# Patient Record
Sex: Female | Born: 2000 | Hispanic: No | Marital: Single | Smoking: Never smoker
Health system: Southern US, Community
[De-identification: ages and names within clinical notes are randomized; demographics above are authoritative.]

## PROBLEM LIST (undated history)

## (undated) DIAGNOSIS — IMO0002 Reserved for concepts with insufficient information to code with codable children: Secondary | ICD-10-CM

## (undated) DIAGNOSIS — F32A Depression, unspecified: Secondary | ICD-10-CM

## (undated) DIAGNOSIS — F329 Major depressive disorder, single episode, unspecified: Secondary | ICD-10-CM

## (undated) DIAGNOSIS — M1990 Unspecified osteoarthritis, unspecified site: Secondary | ICD-10-CM

## (undated) DIAGNOSIS — M329 Systemic lupus erythematosus, unspecified: Secondary | ICD-10-CM

## (undated) DIAGNOSIS — F419 Anxiety disorder, unspecified: Secondary | ICD-10-CM

## (undated) DIAGNOSIS — G4700 Insomnia, unspecified: Secondary | ICD-10-CM

## (undated) HISTORY — DX: Reserved for concepts with insufficient information to code with codable children: IMO0002

## (undated) HISTORY — DX: Major depressive disorder, single episode, unspecified: F32.9

## (undated) HISTORY — DX: Depression, unspecified: F32.A

## (undated) HISTORY — DX: Systemic lupus erythematosus, unspecified: M32.9

## (undated) HISTORY — DX: Insomnia, unspecified: G47.00

## (undated) HISTORY — DX: Anxiety disorder, unspecified: F41.9

---
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
## 2018-01-27 ENCOUNTER — Ambulatory Visit: Payer: Self-pay | Admitting: Physician Assistant

## 2018-01-27 VITALS — BP 120/80 | HR 90 | Temp 99.0°F | Resp 20 | Wt 179.4 lb

## 2018-01-27 DIAGNOSIS — R300 Dysuria: Secondary | ICD-10-CM

## 2018-01-27 LAB — POCT URINALYSIS DIPSTICK
Glucose, UA: NEGATIVE
NITRITE UA: NEGATIVE
PROTEIN UA: NEGATIVE
Urobilinogen, UA: NEGATIVE E.U./dL — AB
pH, UA: 6 (ref 5.0–8.0)

## 2018-01-27 MED ORDER — NITROFURANTOIN MONOHYD MACRO 100 MG PO CAPS: 100.0000 mg | ORAL_CAPSULE | Freq: Two times a day (BID) | ORAL | 0 refills | Status: AC

## 2018-01-27 NOTE — Patient Instructions (Addendum)
Urinary Tract Infection, Adult  Your results indicate you have a UTI. I have given you a prescription for an antibiotic. Please take with food. We do not obtain urine cultures at our office; therefore, if your symptoms worsen over the next 24-48 hours or you develop fever, chills, flank pain, nausea and vomiting, please seek care immediately.   A urinary tract infection (UTI) is an infection of any part of the urinary tract. The urinary tract includes:  The kidneys.  The ureters.  The bladder.  The urethra. These organs make, store, and get rid of pee (urine) in the body. What are the causes? This is caused by germs (bacteria) in your genital area. These germs grow and cause swelling (inflammation) of your urinary tract. What increases the risk? You are more likely to develop this condition if:  You have a small, thin tube (catheter) to drain pee.  You cannot control when you pee or poop (incontinence).  You are female, and: ? You use these methods to prevent pregnancy: ? A medicine that kills sperm (spermicide). ? A dievice that blocks sperm (diaphragm). ? You have low levels of a female hormone (estrogen). ? You are pregnant.  You have genes that add to your risk.  You are sexually active.  You take antibiotic medicines.  You have trouble peeing because of: ? A prostate that is bigger than normal, if you are female. ? A blockage in the part of your body that drains pee from the bladder (urethra). ? A kidney stone. ? A nerve condition that affects your bladder (neurogenic bladder). ? Not getting enough to drink. ? Not peeing often enough.  You have other conditions, such as: ? Diabetes. ? A weak disease-fighting system (immune system). ? Sickle cell disease. ? Gout. ? Injury of the spine. What are the signs or symptoms? Symptoms of this condition include:  Needing to pee right away (urgently).  Peeing often.  Peeing small amounts often.  Pain or burning when  peeing.  Blood in the pee.  Pee that smells bad or not like normal.  Trouble peeing.  Pee that is cloudy.  Fluid coming from the vagina, if you are female.  Pain in the belly or lower back. Other symptoms include:  Throwing up (vomiting).  No urge to eat.  Feeling mixed up (confused).  Being tired and grouchy (irritable).  A fever.  Watery poop (diarrhea). How is this treated? This condition may be treated with:  Antibiotic medicine.  Other medicines.  Drinking enough water. Follow these instructions at home:  Medicines  Take over-the-counter and prescription medicines only as told by your doctor.  If you were prescribed an antibiotic medicine, take it as told by your doctor. Do not stop taking it even if you start to feel better. General instructions  Make sure you: ? Pee until your bladder is empty. ? Do not hold pee for a long time. ? Empty your bladder after sex. ? Wipe from front to back after pooping if you are a female. Use each tissue one time when you wipe.  Drink enough fluid to keep your pee pale yellow.  Keep all follow-up visits as told by your doctor. This is important. Contact a doctor if:  You do not get better after 1-2 days.  Your symptoms go away and then come back. Get help right away if:  You have very bad back pain.  You have very bad pain in your lower belly.  You have a fever.  have a fever.  You are sick to your stomach (nauseous).  You are throwing up. Summary  A urinary tract infection (UTI) is an infection of any part of the urinary tract.  This condition is caused by germs in your genital area.  There are many risk factors for a UTI. These include having a small, thin tube to drain pee and not being able to control when you pee or poop.  Treatment includes antibiotic medicines for germs.  Drink enough fluid to keep your pee pale yellow. This information is not intended to replace advice given to you by your health care  provider. Make sure you discuss any questions you have with your health care provider. Document Released: 06/11/2007 Document Revised: 07/02/2017 Document Reviewed: 07/02/2017 Elsevier Interactive Patient Education  2019 Elsevier Inc.  

## 2018-01-27 NOTE — Progress Notes (Signed)
01/27/2018 at 2:57 PM  Bethany Terry / DOB: [birth year] / MRN: [id number]  The patient has Generalized anxiety disorder; Persistent depressive disorder with atypical features, currently moderate; Unobtainable family history due to adoption-from [place] at age 18; Self-inflicted injury; Knee pain; Exercise-induced bronchospasm; Thoracic back pain; Attention deficit hyperactivity disorder (ADHD), combined type, moderate; and Insomnia disorder, with non-sleep disorder mental comorbidity, episodic on their problem list.  SUBJECTIVE  Bethany Terry is a 18 y.o. female who complains of dysuria, urinary frequency and urinary urgency x 2 weeks. She denies hematuria, flank pain, abdominal pain, pelvic pain, cloudy malordorous urine, genital rash, genital irritation and vaginal discharge. Has tried AZO with some relief. Most recent UTI prior to this was a few years ago. She is sexually active with monogamous partner. LMP ~2 weeks ago. Takes OCP daily.   She  has a past medical history of Anxiety, Arthritis, Depression, and Insomnia.    Medications reviewed and updated by myself where necessary, and exist elsewhere in the encounter.   Ms. Kupferman has No Known Allergies. She  reports that she has never smoked. She has never used smokeless tobacco. She reports that she does not drink alcohol or use drugs. She  reports being sexually active. She reports using the following method of birth control/protection: None. The patient  has no past surgical history on file.  Her family history is not on file. She was adopted.  Review of Systems  Constitutional: Negative for chills, diaphoresis and fever.  Gastrointestinal: Negative for nausea and vomiting.  Neurological: Negative for dizziness.    OBJECTIVE  Her  weight is 179 lb 6.4 oz (81.4 kg). Her oral temperature is 99 F (37.2 C). Her blood pressure is 120/80 and her pulse is 90. Her respiration is 20 and oxygen saturation is 99%.  The patient's body mass index is 33.9  kg/m.  Physical Exam Vitals signs reviewed.  Constitutional:      General: She is not in acute distress.    Appearance: Normal appearance. She is well-developed.  HENT:     Head: Normocephalic and atraumatic.  Eyes:     Conjunctiva/sclera: Conjunctivae normal.  Neck:     Musculoskeletal: Normal range of motion.  Pulmonary:     Effort: Pulmonary effort is normal.  Abdominal:     Palpations: Abdomen is soft.     Tenderness: There is no abdominal tenderness. There is no right CVA tenderness, left CVA tenderness or guarding.  Skin:    General: Skin is warm and dry.  Neurological:     Mental Status: She is alert and oriented to person, place, and time.     Results for orders placed or performed in visit on 01/27/18 (from the past 24 hour(s))  POCT urinalysis dipstick     Status: Abnormal   Collection Time: 01/27/18  2:40 PM  Result Value Ref Range   Color, UA Yellow    Clarity, UA Clear    Glucose, UA Negative Negative   Bilirubin, UA Small+    Ketones, UA Large    Spec Grav, UA >=1.030 (A) 1.010 - 1.025   Blood, UA Trace    pH, UA 6.0 5.0 - 8.0   Protein, UA Negative Negative   Urobilinogen, UA negative (A) 0.2 or 1.0 E.U./dL   Nitrite, UA Neg    Leukocytes, UA Moderate (2+) (A) Negative   Appearance     Odor      ASSESSMENT & PLAN  Rande was seen today for self pay- burn when  urinate, discomfort, frequentx2weeks0.  Diagnoses and all orders for this visit:  Dysuria -     POCT urinalysis dipstick  Other orders -     nitrofurantoin, macrocrystal-monohydrate, (MACROBID) 100 MG capsule; Take 1 capsule (100 mg total) by mouth 2 (two) times daily for 5 days.     Hx and UA suggestive of UTI. + leukocytes, blood, and ketones. Spec gravity elevated.  No glucosuria.  Will treat empirically at this time for UTI.  No concerning signs or symptoms suggesting pyelonephritis.  She is overall well-appearing, no acute distress.  She is afebrile.  Explained to patient that we  do not obtain urine cultures in our office.  If she is not having any improvement over the next 24 to 48 hours, recommend follow-up with family doctor as she will likely need urine culture at that time.  If any of her symptoms worsen or she develops new concerning symptoms, recommend she seek care immediately at local urgent care or ED.   Benjiman Core, Cordelia Poche  [facility] 01/27/2018 2:57 PM

## 2018-01-29 ENCOUNTER — Telehealth: Payer: Self-pay

## 2018-01-29 NOTE — Telephone Encounter (Signed)
Patient did not answered the voicemail is not set up.

## 2018-02-08 ENCOUNTER — Ambulatory Visit: Payer: BC Managed Care – PPO | Admitting: Psychiatry

## 2018-02-15 ENCOUNTER — Ambulatory Visit (INDEPENDENT_AMBULATORY_CARE_PROVIDER_SITE_OTHER): Payer: Self-pay | Admitting: Psychiatry

## 2018-02-15 ENCOUNTER — Encounter: Payer: Self-pay | Admitting: Psychiatry

## 2018-02-15 VITALS — BP 110/72 | HR 72 | Ht 61.0 in | Wt 178.0 lb

## 2018-02-15 DIAGNOSIS — F902 Attention-deficit hyperactivity disorder, combined type: Secondary | ICD-10-CM

## 2018-02-15 DIAGNOSIS — F5105 Insomnia due to other mental disorder: Secondary | ICD-10-CM

## 2018-02-15 DIAGNOSIS — F411 Generalized anxiety disorder: Secondary | ICD-10-CM

## 2018-02-15 DIAGNOSIS — F341 Dysthymic disorder: Secondary | ICD-10-CM

## 2018-02-15 DIAGNOSIS — G4700 Insomnia, unspecified: Secondary | ICD-10-CM

## 2018-02-15 MED ORDER — AMPHETAMINE-DEXTROAMPHET ER 30 MG PO CP24: 30.0000 mg | ORAL_CAPSULE | Freq: Every day | ORAL | 0 refills | Status: DC

## 2018-02-15 NOTE — Progress Notes (Signed)
[facility] Med Check  Patient ID: Bethany Terry,  MRN: [id number]  PCP: Sherren Mocha, MD  Date of Evaluation: 02/15/2018 Time spent:10 minutes  Chief Complaint:  Chief Complaint    ADHD; Anxiety; Depression      HISTORY/CURRENT STATUS: Bethany Terry is seen individually face-to-face with consent not collateral for psychiatric interview and exam in 14-month evaluation and management of ADHD/GAD/mild persistent depression.  She planned to take Adderall and Xanax last appointment, but [facility] of controlled substance documents she last filled both 11/09/2017 at last appointment and none since.  She has 2 refills of Xanax remaining and doubts that her Adderall fills are available at pharmacy.  She continues dating boyfriend and has her own apartment, spending little time with adoptive parents.  She is not seeing [name] for therapy as last session was 08/21/2017.  Patient seems to disapprove of her hard work rather than having a sense of accomplishment, though she does keep going better when she feels pushed even if by herself.  She is not willing to consider antidepressant such as Pamelor again, and Abilify/Geodon discussed at last appointment as an alternative approach to mood and anxiety when all others have failed in her opinion are not acceptable today.  Depression       The patient presents with depression.  This is a recurrent problem.  The current episode started more than 1 year ago.   The onset quality is gradual.   The problem occurs intermittently.  The problem has been waxing and waning since onset.  Associated symptoms include decreased concentration, fatigue, hopelessness, decreased interest and sad.  Associated symptoms include not irritable, no restlessness, no appetite change, no body aches, no headaches, no indigestion and no suicidal ideas.     The symptoms are aggravated by work stress, social issues and family issues.  Past treatments include SNRIs - Serotonin and  norepinephrine reuptake inhibitors, SSRIs - Selective serotonin reuptake inhibitors, other medications and psychotherapy.  Compliance with treatment is variable.  Past compliance problems include difficulty with treatment plan and medication issues.  Previous treatment provided mild relief.  Risk factors include a change in medication usage/dosage, history of mental illness, stress and major life event.   Past medical history includes recent illness, anxiety, depression, mental health disorder and post-traumatic stress disorder.     Pertinent negatives include no chronic fatigue syndrome, no fibromyalgia, no thyroid problem, no physical disability, no recent psychiatric admission, no bipolar disorder, no eating disorder, no obsessive-compulsive disorder, no schizophrenia, no suicide attempts and no head trauma.   Individual Medical History/ Review of Systems: Changes? :Yes Despite stopping Cymbalta in September for patient suspecting it was a cause of fatigue, the patient remains tired as always now attributing that to working full-time and having 4 credit hours of school this semester.  Patient may have some satisfaction and sense of security in being fatigued as if testimony to how hard she works and parents doubted.  She was seen by urgent care for urinary symptoms twice in the interim treated with Macrodantin last.  Allergies: Patient has no known allergies.  Current Medications:  Current Outpatient Medications:  .  albuterol (VENTOLIN HFA) 108 (90 Base) MCG/ACT inhaler, Inhale 2 puffs into the lungs every 6 (six) hours as needed for wheezing., Disp: 1 Inhaler, Rfl: 0 .  ALPRAZolam (XANAX) 1 MG tablet, Take 1 tablet (1 mg total) by mouth 2 (two) times daily as needed for anxiety., Disp: 60 tablet, Rfl: 3 .  amphetamine-dextroamphetamine (ADDERALL XR)  30 MG 24 hr capsule, Take 1 capsule (30 mg total) by mouth every morning. (Patient not taking: Reported on 10/10/2017), Disp: 30 capsule, Rfl: 0 .   amphetamine-dextroamphetamine (ADDERALL XR) 30 MG 24 hr capsule, Take 1 capsule (30 mg total) by mouth daily for 30 days., Disp: 30 capsule, Rfl: 0 .  [START ON 03/17/2018] amphetamine-dextroamphetamine (ADDERALL XR) 30 MG 24 hr capsule, Take 1 capsule (30 mg total) by mouth daily for 30 days., Disp: 30 capsule, Rfl: 0 .  [START ON 04/16/2018] amphetamine-dextroamphetamine (ADDERALL XR) 30 MG 24 hr capsule, Take 1 capsule (30 mg total) by mouth daily for 30 days., Disp: 30 capsule, Rfl: 0 .  cyclobenzaprine (FLEXERIL) 10 MG tablet, Take 1 tablet (10 mg total) 3 (three) times daily as needed by mouth for muscle spasms. (Patient not taking: Reported on 10/10/2017), Disp: 30 tablet, Rfl: 0 .  HYDROcodone-acetaminophen (NORCO/VICODIN) 5-325 MG tablet, Take 1 tablet by mouth every 6 (six) hours as needed for severe pain. (Patient not taking: Reported on 01/27/2018), Disp: 10 tablet, Rfl: 0 .  meloxicam (MOBIC) 15 MG tablet, Take 1 tablet (15 mg total) daily by mouth. (Patient not taking: Reported on 10/10/2017), Disp: 14 tablet, Rfl: 0 Medication Side Effects: none  Family Medical/ Social History: Changes? No  MENTAL HEALTH EXAM: Muscle strengths and tone 5/5, postural reflexes and gait 0/0, and AIMS = 0. Blood pressure 110/72, pulse 72, height 5\' 1"  (1.549 m), weight 178 lb (80.7 kg).Body mass index is 33.63 kg/m.  General Appearance: Casual, Fairly Groomed, Guarded and Obese  Eye Contact:  Fair  Speech:  Blocked and Clear and Coherent  Volume:  Normal  Mood:  Anxious, Dysphoric, Irritable and Worthless  Affect:  Constricted, Inappropriate, Labile and Anxious  Thought Process:  Goal Directed, Irrelevant and Linear  Orientation:  Full (Time, Place, and Person)  Thought Content: Obsessions, Paranoid Ideation and Rumination   Suicidal Thoughts:  No  Homicidal Thoughts:  No  Memory:  Immediate;   Good Remote;   Good  Judgement:  Fair  Insight:  Fair and Lacking  Psychomotor Activity:  Normal and  Decreased  Concentration: Concentration: Fair and Attention: Fair  Recall:  [employer] of Knowledge: Good  Language: Good  Assets:  Resilience Talents/Skills Vocational/Educational  ADL's:  Intact  Cognition: WNL  Prognosis:  Good    DIAGNOSES:    ICD-10-CM   1. Attention deficit hyperactivity disorder (ADHD), combined type, moderate F90.2 amphetamine-dextroamphetamine (ADDERALL XR) 30 MG 24 hr capsule    amphetamine-dextroamphetamine (ADDERALL XR) 30 MG 24 hr capsule    amphetamine-dextroamphetamine (ADDERALL XR) 30 MG 24 hr capsule  2. Generalized anxiety disorder F41.1   3. Persistent depressive disorder with atypical features, currently mild F34.1   4. Insomnia disorder, with non-sleep disorder mental comorbidity, episodic G47.00     Receiving Psychotherapy: Yes though last attended 6 months ago with [name], [facility]   RECOMMENDATIONS: Patient offers no specific reason for continuing to abstain from her Adderall as she describes fatigue that she attributed to other medications discontinued without resolution.  She may be attempting to washout medication to see if she feels and functions better.  She anticipates episodic need for Xanax mainly keeping it available if needed for anxiety as she had taken it frequently before work often 2 of the 1 mg tablets.  Adderall is escribed 30 mg XR as 1 capsule every morning #60 each for February, March, and April sent to [facility] on [address] [facility] if she will fill  these.  She declines Pamelor and seems to manifest functioning better than she describes or accepts having apparently disengaged from all treatment except residual Adderall and Xanax as needed.  Therefore she allows minimal treatment now while expecting that she will need more.   Chauncey MannGlenn E Levoy Geisen, MD

## 2018-03-15 ENCOUNTER — Ambulatory Visit: Payer: BLUE CROSS/BLUE SHIELD | Admitting: Psychiatry

## 2018-03-15 ENCOUNTER — Encounter: Payer: Self-pay | Admitting: Psychiatry

## 2018-03-15 DIAGNOSIS — F411 Generalized anxiety disorder: Secondary | ICD-10-CM | POA: Diagnosis not present

## 2018-03-15 DIAGNOSIS — F902 Attention-deficit hyperactivity disorder, combined type: Secondary | ICD-10-CM

## 2018-03-15 NOTE — Progress Notes (Signed)
      [facility] Counselor/Therapist Progress Note  Patient ID: Ryah Mirro, MRN: [id number],    Date: 03/15/2018  Time Spent: 51 minutes  Treatment Type: Individual Therapy  Reported Symptoms: anxiety, focusing, panic attack  Mental Status Exam:  Appearance:   Casual     Behavior:  Appropriate  Motor:  Normal  Speech/Language:   Normal Rate  Affect:  Congruent  Mood:  anxious  Thought process:  normal  Thought content:    Rumination  Sensory/Perceptual disturbances:    WNL  Orientation:  oriented to person, place, time/date and situation  Attention:  Good  Concentration:  Good  Memory:  WNL  Fund of knowledge:   Good  Insight:    Good  Judgment:   Good  Impulse Control:  Good   Risk Assessment: Danger to Self:  No Self-injurious Behavior: No Danger to Others: No Duty to Warn:no Physical Aggression / Violence:No  Access to Firearms a concern: No  Gang Involvement:No   Subjective: Patient was present for session.  Patient explained she had gone so long between appointments due to losing her insurance.  Patient explained that her mother did not tell her she was canceling her insurance and then they had to scramble to get something very quickly and it took 2 months to get her cards.  Patient went on to explain she is very frustrated with her mother because of the way she handled the situation.  Patient and her father are continuing to do fine even though she is disappointed with him for not telling her what her mother was planning.  Patient developed treatment plan in session.  She reported her anxiety was her biggest concern and she wanted to address that.  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  She also explained she is trying to figure out what to do with her friend who's behaviors have not been healthy recently.  Patient was encouraged to take things 1  step at a time and to start thinking more about what she does want for her future.  Patient was encouraged to write things down and her bring them to the next session to process through further.  Interventions: Solution-Oriented/Positive Psychology  Diagnosis:   ICD-10-CM   1. Generalized anxiety disorder F41.1   2. Attention deficit hyperactivity disorder (ADHD), combined type F90.2     Plan: 1.  Patient to continue to engage in individual counseling 2-4 times a month or as needed. 2.  Patient to identify and apply CBT, coping skills learned in session to decrease depression and anxiety symptoms. 3.  Patient to contact this office, go to the local ED or call 911 if a crisis or emergency develops between visits.  [name], [place]    This record has been created using [employer].  Chart creation errors have been sought, but may not always have been located and corrected. Such creation errors do not reflect on the standard of medical care.

## 2018-04-09 ENCOUNTER — Ambulatory Visit (INDEPENDENT_AMBULATORY_CARE_PROVIDER_SITE_OTHER): Payer: BLUE CROSS/BLUE SHIELD | Admitting: Psychiatry

## 2018-04-09 ENCOUNTER — Encounter: Payer: Self-pay | Admitting: Psychiatry

## 2018-04-09 ENCOUNTER — Other Ambulatory Visit: Payer: Self-pay

## 2018-04-09 DIAGNOSIS — F902 Attention-deficit hyperactivity disorder, combined type: Secondary | ICD-10-CM

## 2018-04-09 DIAGNOSIS — F411 Generalized anxiety disorder: Secondary | ICD-10-CM | POA: Diagnosis not present

## 2018-04-09 NOTE — Progress Notes (Signed)
[facility] Counselor/Therapist Progress Note  Patient ID: Bethany Terry, MRN: [id number],    Date: 04/09/2018  Time Spent: 50 minutes Start time 9:02 a.m. end time 9:52 AM  I connected with patient by a video enabled telemedicine application or telephone, with their informed consent, and verified patient privacy and that I am speaking with the correct person using two identifiers.  I was located at home and patient at her home.  Treatment Type: Individual Therapy  Reported Symptoms: anxiety, frustration  Mental Status Exam:  Appearance:   NA     Behavior:  Sharing  Motor:  NA  Speech/Language:   Normal Rate  Affect:  NA  Mood:  irritable  Thought process:  normal  Thought content:    WNL  Sensory/Perceptual disturbances:    WNL  Orientation:  oriented to person, place, time/date and situation  Attention:  Good  Concentration:  Good  Memory:  WNL  Fund of knowledge:   Good  Insight:    Good  Judgment:   Good  Impulse Control:  Good   Risk Assessment: Danger to Self:  No Self-injurious Behavior: No Danger to Others: No Duty to Warn:no Physical Aggression / Violence:No  Access to Firearms a concern: No  Gang Involvement:No   Subjective: Met patient via phone.  Patient reported she has been very stressed out and overwhelmed during the situation.  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  Patient explained she is hopeful that when she gets her certification she can get a different job and to start nursing school which is what she is wanting to do.  Patient explained she has been working in [employer] for many years and she is ready for something different.  Patient was encouraged to think of what she needs to tell her self just  to get over this difficult time and to realize she is going to have a new career that this situation is not forever.  Patient was able to think of some self talk/CBT skills to utilize to help her.  Patient also reported she is very frustrated with her parental situation.  She shared that her father had to ask her for money and she felt lots of guilt because she did not have much to give him.  Patient explained it is hard for her to have to feel like the caretaker of her parents when she is only 18 years old.  Patient's emotions were acknowledged and she was encouraged to try and find ways to set limits with her parents as well as other people.  Patient reported that she seems to be somewhat everybody goes to with their problems and then she gets very overwhelmed.  The importance of recognizing she can listen but not fix other people's issues was discussed with patient.  Different strategies to help her maintain perspective were discussed and plans developed.  Agreed to work on the issue more at next session.  Interventions: Cognitive Behavioral Therapy and Solution-Oriented/Positive Psychology  Diagnosis:   ICD-10-CM   1. Generalized anxiety disorder F41.1   2. Attention deficit hyperactivity disorder (ADHD), combined type F90.2     Plan: 1.  Patient to continue to engage in individual counseling 2-4 times a month  or as needed. 2.  Patient to identify and apply CBT, coping skills learned in session to decrease focusing issues and anxiety symptoms. 3.  Patient to contact this office, go to the local ED or call 911 if a crisis or emergency develops between visits.  [name], [facility]

## 2018-04-20 ENCOUNTER — Telehealth: Payer: Self-pay | Admitting: Physician Assistant

## 2018-04-20 ENCOUNTER — Ambulatory Visit: Payer: Self-pay | Admitting: *Deleted

## 2018-04-20 DIAGNOSIS — R519 Headache, unspecified: Secondary | ICD-10-CM

## 2018-04-20 DIAGNOSIS — R509 Fever, unspecified: Secondary | ICD-10-CM

## 2018-04-20 DIAGNOSIS — R51 Headache: Secondary | ICD-10-CM

## 2018-04-20 NOTE — Progress Notes (Signed)
E-Visit for [employer] Virus Screening  Based on what you have shared with me, you need to seek an evaluation for a severe illness that is causing your symptoms which may be coronavirus or some other illness. I recommend that you be seen and evaluated "face to face". Our Emergency Departments are best equipped to handle patients with severe symptoms.   I recommend the following:  If you are having a true medical emergency please call 911. If you are considered high risk for Corona virus because of a known exposure, fever, shortness of breath and cough, OR if you have severe symptoms of any kind, seek medical care at an emergency room.  Please call ahead and tell them that you were seen by telemedicine and they have recommended that you have a face to face evaluation. [facility] Emergency Department [address] [place], [place], [place] [address] [phone]  [facility] Emergency Department [address] [place], [place] [address] [phone]  [facility] Emergency Department [address] [place], [place], [place] [address] [phone]  [facility] Emergency Department [address] [place], [place], [place] [address] [phone]  [facility] Emergency Department [address] [place], [place], [place] [address] [phone]  NOTE: If you entered your credit card information for this eVisit, you will not be charged. You may see a "hold" on your card for the $35 but that hold will drop off and you will not have a charge processed.   Your e-visit answers were reviewed by a board certified advanced clinical practitioner to complete your personal care plan.  Thank you for using e-Visits.   ===View-only below this line===   ----- Message -----    From: Karna Dupes    Sent: 04/20/2018 11:44 AM EDT      To: E-Visit Mailing List Subject: E-Visit Submission: CoronaVirus (COVID-19)  Screening  E-Visit Submission: CoronaVirus (COVID-19) Screening --------------------------------  Question: Do you have any of the following?  Answer:   Shortness of breath            Fever  Question: If you are experiencing trouble breathing please select the severity of this:  Answer:   I have mild trouble breathing but not very often  Question: Do you have any of the following additional symptoms?  Answer:   Body aches            Headache  Question: Have you had a fever? Answer:   Yes  Question: If you are running a fever, please type in your temperature reading Answer:   100.9  Question: How long have you had the fever? Answer:   For a few days  Question: Have others in your home or workplace had similar symptoms? Answer:   Yes  Question: When did your symptoms start? Answer:   Last night mainly  Question: Have you recently visited any of the following countries? Answer:   None of these  Question: If you have traveled anywhere in the last  2 months please document where you have visited: Answer:     Question: Have you recently been around others from these countries or visited these countries who have had coughing or fever? Answer:   No  Question: Have you recently been around anyone who has been diagnosed with Corona virus? Answer:   Yes  Question: Have you been taking any medications? Answer:   Yes  Question: If taking medications for these symptoms, please  list the names and whether they are helping or not Answer:   Ibuprofen- made symptoms significantly worse  Question: Are you treated for any of the following conditions: Asthma, COPD, Diabetes, Renal Failure (on Dialysis), AIDS, any Neuromuscular disease that effects the clearing of secretions, Heart Failure, or Heart Disease? Answer:   Yes  Question: Please enter a phone number where you can be reached if we have additional questions about your symptoms Answer:   [phone]  Question: Please list your  medication allergies that you may have ? (If 'none' , please list as 'none') Answer:   Aleve  Question: Please list any additional comments  Answer:   Also getting severe headaches and migraines, nausea, some pain when taking a deep breath sometimes, chest pain/pressure, hearing loss  Question: Are you pregnant? Answer:   I am confident that I am not pregnant  Question: Are you breastfeeding? Answer:   No

## 2018-04-20 NOTE — Telephone Encounter (Signed)
Pt was evaluated today with an e-visit.

## 2018-04-20 NOTE — Telephone Encounter (Signed)
Headache,nausea, temperature, respiratory symptoms.  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  Reason for Disposition . [1] Fever (or feeling feverish) OR cough AND [2] within 14 Days of COVID-19 EXPOSURE (Close Contact)  Answer Assessment - Initial Assessment Questions 1. 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 PLACE of CONTACT: "Where were you when you were exposed to COVID-19?" (e.g., home, school, medical waiting room; which city?)     work 3. TYPE of CONTACT: "How much contact was there?" (e.g., sitting next to, live in same house, work in same office, same building)     Varied- delivery of meals and work in [employer] before closed- on 2 weeks ago 4. DURATION of CONTACT: "How long were you in contact with the COVID-19 patient?" (e.g., a few seconds, passed by person, a few minutes, live with the patient)     Patient was delivering in wing of resident who tested positive. 5. DATE of CONTACT: "When did you have contact with a COVID-19 patient?" (e.g., how many days ago)     2 weeks ago- patient does not work in that wing frequently 6. TRAVEL: "Have you traveled out of the country recently?" If so, "When and where?"     * Also ask about out-of-state travel, since the CDC has identified some high risk cities for community spread in the [place].     * Note: Travel becomes less relevant if there is widespread community transmission where the patient lives.     No travel 7. COMMUNITY SPREAD: "Are there lots of cases or COVID-19 (community spread) where you live?" (See public  health department website, if unsure)   * MAJOR community spread: high number of cases; numbers of cases are increasing; many people hospitalized.   * MINOR community spread: low number of cases; not increasing; few or no people hospitalized     Community spread 8. SYMPTOMS: "Do you have any symptoms?" (e.g., fever, cough, breathing difficulty)     Fever- 100.9 last night breathing- comes/goes- exertion makes her out of breath and have chest pain- full deep breath causes pain most of time, nausea- last night, headache- in eyes and back of head, dizziness and lightness 9. PREGNANCY OR POSTPARTUM: "Is there any chance you are pregnant?" "When was your last menstrual period?" "Did you deliver in the last 2 weeks?"     Test-negative- LMP- 1 week ago 10. HIGH RISK: "Do you have any heart or lung problems? Do you have a weak immune system?" (e.g., CHF, COPD, asthma, HIV positive, chemotherapy, renal failure, diabetes mellitus, sickle cell anemia)       Asthma history  Protocols used: CORONAVIRUS (COVID-19) EXPOSURE-A-AH

## 2018-06-14 ENCOUNTER — Other Ambulatory Visit: Payer: Self-pay

## 2018-06-14 ENCOUNTER — Ambulatory Visit (INDEPENDENT_AMBULATORY_CARE_PROVIDER_SITE_OTHER): Payer: BC Managed Care – PPO | Admitting: Psychiatry

## 2018-06-14 ENCOUNTER — Encounter: Payer: Self-pay | Admitting: Psychiatry

## 2018-06-14 VITALS — Ht 61.0 in | Wt 178.0 lb

## 2018-06-14 DIAGNOSIS — F341 Dysthymic disorder: Secondary | ICD-10-CM | POA: Diagnosis not present

## 2018-06-14 DIAGNOSIS — G4700 Insomnia, unspecified: Secondary | ICD-10-CM

## 2018-06-14 DIAGNOSIS — F411 Generalized anxiety disorder: Secondary | ICD-10-CM

## 2018-06-14 DIAGNOSIS — F902 Attention-deficit hyperactivity disorder, combined type: Secondary | ICD-10-CM | POA: Diagnosis not present

## 2018-06-14 DIAGNOSIS — F5105 Insomnia due to other mental disorder: Secondary | ICD-10-CM

## 2018-06-14 MED ORDER — ALPRAZOLAM 1 MG PO TABS: 1.0000 mg | ORAL_TABLET | Freq: Two times a day (BID) | ORAL | 3 refills | Status: DC | PRN

## 2018-06-14 MED ORDER — AMPHETAMINE-DEXTROAMPHET ER 30 MG PO CP24: 30.0000 mg | ORAL_CAPSULE | Freq: Every day | ORAL | 0 refills | Status: DC

## 2018-06-14 NOTE — Progress Notes (Signed)
[facility]  Patient ID: Bethany Terry,  MRN: [id number]  PCP: Bethany Terry  Date of Evaluation: 06/14/2018 Time spent:20 minutes from 0900 to 0920  Chief Complaint:  Chief Complaint    Anxiety; ADHD; Depression; Stress      HISTORY/CURRENT STATUS: Bethany Terry is seen in office individually face-to-face with consent with epic collateral for psychiatric interview and exam in 6479-month evaluation and management of generalized anxiety, dysthymia, ADHD, and insomnia.  Patient had febrile illness from mid April for 2 weeks requiring ED and multiple outpatient reviews negative for COVID but not responding to symptom management well.  Her difficulty with sense of breathing and need to vomit became migraine daily for 3 weeks.  She is now back [facility] [employer] [facility] on the job for 2 weeks as she and boyfriend will likely buy a home together in 1 week.  She has little contact with adoptive parents.  She uses Xanax only as needed with last fill per [place] registry being 02/24/2018 and last fill for Adderall 05/13/2018 though she is only taking Adderall now for in the morning for work.  She is exercising more but has no place or opportunity for such.  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  Weight is stable at 178 pounds.  She has numerous somatic concerns like always with no personal interest or conclusion for changing medication management.  She has no mania, suicidality, psychosis, or delirium.   Depression       The patient presents with depression as a recurrent problem.  The current episode started more than 1 year ago.   The onset quality is gradual.   The problem occurs intermittently.  The problem has been waxing and waning since onset.  Associated symptoms include decreased concentration, fatigue, hopelessness, headaches, body aches, decreased interest and sad.  Associated symptoms include not irritable, no restlessness, no  appetite change, no indigestion and no suicidal ideas.     The symptoms are aggravated by work stress, social issues and family issues.  Past treatments include SNRIs - Serotonin and norepinephrine reuptake inhibitors, SSRIs - Selective serotonin reuptake inhibitors, other medications and psychotherapy.  Compliance with treatment is variable.  Past compliance problems include difficulty with treatment plan and medication issues.  Previous treatment provided mild relief.  Risk factors include a change in medication usage/dosage, history of mental illness, stress and major life event.   Past medical history includes recent illness, anxiety, depression, mental health disorder and post-traumatic stress disorder.     Pertinent negatives include no chronic fatigue syndrome, no fibromyalgia, no thyroid problem, no physical disability, no recent psychiatric admission, no bipolar disorder, no eating disorder, no obsessive-compulsive disorder, no schizophrenia, no suicide attempts and no head trauma.  Individual Medical History/ Review of Systems: Changes? :Yes Coronavirus screen was apparently through her employer.  Allergies: Patient has no known allergies.  Current Medications:  Current Outpatient Medications:  .  albuterol (VENTOLIN HFA) 108 (90 Base) MCG/ACT inhaler, Inhale 2 puffs into the lungs every 6 (six) hours as needed for wheezing., Disp: 1 Inhaler, Rfl: 0 .  ALPRAZolam (XANAX) 1 MG tablet, Take 1 tablet (1 mg total) by mouth 2 (two) times daily as needed for anxiety., Disp: 60 tablet, Rfl: 3 .  amphetamine-dextroamphetamine (ADDERALL XR) 30 MG 24 hr capsule, Take 1 capsule (30 mg total) by mouth every morning. (Patient not taking: Reported on 10/10/2017), Disp: 30 capsule, Rfl: 0 .  amphetamine-dextroamphetamine (ADDERALL XR) 30 MG 24 hr capsule, Take 1 capsule (30 mg total) by mouth daily for 30 days., Disp: 30 capsule, Rfl: 0 .  amphetamine-dextroamphetamine (ADDERALL XR) 30 MG 24 hr capsule, Take 1  capsule (30 mg total) by mouth daily for 30 days., Disp: 30 capsule, Rfl: 0 .  amphetamine-dextroamphetamine (ADDERALL XR) 30 MG 24 hr capsule, Take 1 capsule (30 mg total) by mouth daily for 30 days., Disp: 30 capsule, Rfl: 0 .  cyclobenzaprine (FLEXERIL) 10 MG tablet, Take 1 tablet (10 mg total) 3 (three) times daily as needed by mouth for muscle spasms. (Patient not taking: Reported on 10/10/2017), Disp: 30 tablet, Rfl: 0 .  HYDROcodone-acetaminophen (NORCO/VICODIN) 5-325 MG tablet, Take 1 tablet by mouth every 6 (six) hours as needed for severe pain. (Patient not taking: Reported on 01/27/2018), Disp: 10 tablet, Rfl: 0 .  meloxicam (MOBIC) 15 MG tablet, Take 1 tablet (15 mg total) daily by mouth. (Patient not taking: Reported on 10/10/2017), Disp: 14 tablet, Rfl: 0   Medication Side Effects: none  Family Medical/ Social History: Changes? Yes planning [employer] school as she boyfriend buy a house together and she continues working as well as her training  [place]:  Height 5\' 1"  (1.549 m), weight 178 lb (80.7 kg).Body mass index is 33.63 kg/m.  Otherwise deferred for coronavirus pandemic  General Appearance: Casual, Fairly Groomed, Guarded and Obese  Eye Contact:  Fair  Speech:  Clear and Coherent, Normal Rate and Talkative  Volume: Decreased to normal  Mood:  Anxious, Depressed, Dysphoric, Irritable and Worthless  Affect:  Depressed, Full Range and Anxious  Thought Process:  Coherent, Goal Directed and Linear  Orientation:  Full (Time, Place, and Person)  Thought Content: Ilusions, Obsessions and Rumination   Suicidal Thoughts:  No  Homicidal Thoughts:  No  Memory: Immediate:  Good Long-term remote:  Fair  Judgement:  Fair  Insight:  Fair and Lacking  Psychomotor Activity:  Normal, Decreased, Mannerisms and Restlessness  Concentration:  Concentration: Fair and Attention Span: Poor  Recall:  [employer] of Knowledge: Fair  Language: Fair  Assets:  Desire for  Improvement Leisure Time Talents/Skills  ADL's:  Intact  Cognition: WNL  Prognosis:  Fair    DIAGNOSES:    ICD-10-CM   1. Generalized anxiety disorder F41.1   2. 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 Attention deficit hyperactivity disorder (ADHD), combined type, moderate F90.2   4. 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   RECOMMENDATIONS: Symptoms are generally moderate but at times mild though considered manageable by patient with her Xanax and Adderall.  She continues as in the recent past to refuse Pristiq, Pamelor, or Abilify for more comprehensive symptom management.  At this time she is E scribed Adderall 30 mg XR every morning for ADHD as a 30 day supply each for June 8, July 8, and August 7 for [facility] [address]  She is E scribed to continue alprazolam 1 mg twice daily as needed for anxiety or agitation sent as #60 with [facility] [address]  [place] returns for follow-up in 4 months sooner if needed.   Delight Hoh, Terry

## 2018-09-27 ENCOUNTER — Encounter

## 2018-09-27 ENCOUNTER — Ambulatory Visit: Payer: BC Managed Care – PPO | Admitting: Psychiatry

## 2018-10-18 ENCOUNTER — Ambulatory Visit: Payer: BC Managed Care – PPO | Admitting: Psychiatry

## 2018-10-25 ENCOUNTER — Encounter: Payer: Self-pay | Admitting: Psychiatry

## 2018-10-25 ENCOUNTER — Other Ambulatory Visit: Payer: Self-pay

## 2018-10-25 ENCOUNTER — Ambulatory Visit (INDEPENDENT_AMBULATORY_CARE_PROVIDER_SITE_OTHER): Payer: Self-pay | Admitting: Psychiatry

## 2018-10-25 VITALS — Ht 61.0 in | Wt 185.0 lb

## 2018-10-25 DIAGNOSIS — F902 Attention-deficit hyperactivity disorder, combined type: Secondary | ICD-10-CM

## 2018-10-25 DIAGNOSIS — F341 Dysthymic disorder: Secondary | ICD-10-CM

## 2018-10-25 DIAGNOSIS — F5105 Insomnia due to other mental disorder: Secondary | ICD-10-CM

## 2018-10-25 DIAGNOSIS — F411 Generalized anxiety disorder: Secondary | ICD-10-CM

## 2018-10-25 DIAGNOSIS — F410 Panic disorder [episodic paroxysmal anxiety] without agoraphobia: Secondary | ICD-10-CM | POA: Insufficient documentation

## 2018-10-25 MED ORDER — ALPRAZOLAM 2 MG PO TABS: 2.0000 mg | ORAL_TABLET | Freq: Every day | ORAL | 3 refills | Status: DC

## 2018-10-25 MED ORDER — PROPRANOLOL HCL 20 MG PO TABS: 20.0000 mg | ORAL_TABLET | Freq: Three times a day (TID) | ORAL | 2 refills | Status: DC | PRN

## 2018-10-25 NOTE — Progress Notes (Signed)
[facility]  Patient ID: Bethany Terry,  MRN: [id number]  PCP: Bethany Terry  Date of Evaluation: 10/25/2018 Time spent:20 minutes from 1320 to 1340  Chief Complaint:  Chief Complaint    Panic Attack; Anxiety; Depression; ADHD      HISTORY/CURRENT STATUS: Bethany Terry is seen onsite in office 20 minutes face-to-face individually with consent with epic collateral for psychiatric interview and exam in 4941-month evaluation and management of generalized and panic anxiety, dysthymia, ADHD, and insomnia disorder.  Patient has continued to gradually disengage from medication management in her adult life so that at last appointment she was taking only as needed Xanax and the Adderall.  Adoptive parents had significant conflicts over patient's medication through her high school years being previously treated with multiple standing medications until mother going through menopause approved of patient having Xanax like mother.  Adoptive father gradually disengaged allowing patient individuation though as boyfriend became more influential and determinant in her daily life.  She last took Trileptal in July 2019 changed to Cymbalta again taken briefly in repeat of 3 to 5 years ago, but since then she has taken only the Adderall and Xanax.  She has now discontinued the Adderall for 2 weeks stating she is taking a break except for days of extreme responsibility working 2 jobs.  She has purchased a home with her boyfriend doing well with this but having many decisions to make.  The end of July for chest pain she had a CT scan of chest for sharp shooting pain concluded to likely be anxiety.  She feels most comfortable something else for panic besides the Xanax she takes mainly at bedtime for sleep.  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  Adoptive family is doing well by her report.  She has no mania, substance use, suicidality, psychosis or  delirium.  Depression  The patient presents withdepression as a recurrentproblem.The current episode started more than 1 year ago. The onset quality is gradual. The problem occurs intermittently.The problem has been waxing and waningsince onset.Associated symptoms include decreased concentration,fatigue,headaches, body aches, decreased interestand sad. Associated symptoms include not irritable,no restlessness,no appetite change, no hopelessness,no indigestionand no suicidal ideas.The symptoms are aggravated by work stress, social issues and family issues.Past treatments include SNRIs - Serotonin and norepinephrine reuptake inhibitors, SSRIs - Selective serotonin reuptake inhibitors, other medications and psychotherapy.Compliance with treatment is variable.Past compliance problems include difficulty with treatment plan and medication issues.Previous treatment provided mildrelief.Risk factors include a change in medication usage/dosage, history of mental illness, stress and major life event. Past medical history includes recent illness,anxiety,depression,mental health disorderand post-traumatic stress disorder. Pertinent negatives include no chronic fatigue syndrome,no fibromyalgia,no thyroid problem,no physical disability,no recent psychiatric admission,no bipolar disorder,no eating disorder,no obsessive-compulsive disorder,no schizophrenia,no suicide attemptsand no head trauma.  Individual Medical History/ Review of Systems: Changes? :Yes Patient is medically based in her job but has escalating panic both on the job and other times elsewhere that likely prompted her cessation of Adderall and asking for another panic rescue medication.  Allergies: Patient has no known allergies.  Current Medications:  Current Outpatient Medications:  .  albuterol (VENTOLIN HFA) 108 (90 Base) MCG/ACT inhaler, Inhale 2 puffs into the lungs every 6 (six) hours as  needed for wheezing., Disp: 1 Inhaler, Rfl: 0 .  ALPRAZolam (XANAX) 1 MG tablet, Take 1 tablet (1 mg total) by mouth 2 (two) times daily as needed for anxiety or sleep., Disp: 60 tablet, Rfl: 3 .  amphetamine-dextroamphetamine (ADDERALL XR) 30 MG 24 hr capsule, Take 1 capsule (30 mg total) by mouth daily for 30 days., Disp: 30 capsule, Rfl: 0 .  amphetamine-dextroamphetamine (ADDERALL XR) 30 MG 24 hr capsule, Take 1 capsule (30 mg total) by mouth daily for 30 days., Disp: 30 capsule, Rfl: 0 .  amphetamine-dextroamphetamine (ADDERALL XR) 30 MG 24 hr capsule, Take 1 capsule (30 mg total) by mouth daily for 30 days., Disp: 30 capsule, Rfl: 0 .  amphetamine-dextroamphetamine (ADDERALL XR) 30 MG 24 hr capsule, Take 1 capsule (30 mg total) by mouth daily for 30 days., Disp: 30 capsule, Rfl: 0 .  cyclobenzaprine (FLEXERIL) 10 MG tablet, Take 1 tablet (10 mg total) 3 (three) times daily as needed by mouth for muscle spasms. (Patient not taking: Reported on 10/10/2017), Disp: 30 tablet, Rfl: 0 .  HYDROcodone-acetaminophen (NORCO/VICODIN) 5-325 MG tablet, Take 1 tablet by mouth every 6 (six) hours as needed for severe pain. (Patient not taking: Reported on 01/27/2018), Disp: 10 tablet, Rfl: 0 .  meloxicam (MOBIC) 15 MG tablet, Take 1 tablet (15 mg total) daily by mouth. (Patient not taking: Reported on 10/10/2017), Disp: 14 tablet, Rfl: 0   Medication Side Effects: none  Family Medical/ Social History: Changes? Yes adopted from [place] with adoptive siblings likely older and adoptive parents individuation separation  MENTAL HEALTH EXAM:  Height 5\' 1"  (1.549 m), weight 185 lb (83.9 kg).Body mass index is 34.96 kg/m. Muscle strengths and tone 5/5, postural reflexes and gait 0/0, and AIMS = 0 others deferred for coronavirus shutdown  General Appearance: Casual, Fairly Groomed, Guarded, Meticulous and Obese  Eye Contact:  Fair  Speech:  Blocked, Clear and Coherent, Normal Rate and Talkative  Volume:  Normal   Mood:  Anxious, Depressed, Dysphoric, Euthymic, Irritable and Worthless  Affect:  Congruent, Depressed, Labile, Full Range, Anxious and Adoptive mother having worried about bipolar not found over time  Thought Process:  Coherent, Irrelevant and Descriptions of Associations: Tangential  Orientation:  Full (Time, Place, and Person)  Thought Content: Ilusions, Obsessions, Rumination and Tangential   Suicidal Thoughts:  No  Homicidal Thoughts:  No  Memory:  Immediate;   Good Remote;   Fair  Judgement:  Fair  Insight:  Fair  Psychomotor Activity:  Normal, Increased, Mannerisms and Restlessness  Concentration:  Concentration: Fair and Attention Span: Fair  Recall:  Good  Fund of Knowledge: Good  Language: Good  Assets:  Desire for Improvement Leisure Time Resilience Vocational/Educational  ADL's:  Intact  Cognition: WNL  Prognosis:  Fair    DIAGNOSES:    ICD-10-CM   1. Generalized anxiety disorder  F41.1   2. Panic disorder  F41.0   3. Persistent depressive disorder with atypical features, currently moderate  F34.1   4. Attention deficit hyperactivity disorder (ADHD), combined type, moderate  F90.2   5. Insomnia disorder, with non-sleep disorder mental comorbidity, episodic  F51.05     Receiving Psychotherapy: Yes previously with , LPC to hopefully resume.   RECOMMENDATIONS: Patient plans general medical exam with PCP including lab work for her concerns that panic may be metabolic, cardiorespiratory, or endocrinologic.  At the same time hopefully she will begin therapy again.  At this time she is taking Xanax 2 mg every bedtime not during the day and does not consider it rescue for panic in that way.  She will start propranolol 20 mg up to 3 times daily if needed for panic anxiety sent as #90 with [facility] [place].  Xanax is escribed 2 mg every bedtime #30 with 3 refills for panic and generalized anxiety insomnia.  She has two E scription's for Adderall 30 mg XR  every morning remaining with the pharmacy from 06/14/2018 office appointment for ADHD if needed currently taking a break from the Adderall to return in 3 months or sooner if needed for follow-up.   Delight Hoh, Terry

## 2018-10-28 ENCOUNTER — Other Ambulatory Visit: Payer: Self-pay

## 2018-10-28 ENCOUNTER — Ambulatory Visit (INDEPENDENT_AMBULATORY_CARE_PROVIDER_SITE_OTHER): Payer: Self-pay | Admitting: Psychiatry

## 2018-10-28 DIAGNOSIS — F411 Generalized anxiety disorder: Secondary | ICD-10-CM
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
## 2018-12-07 ENCOUNTER — Encounter: Payer: Self-pay | Admitting: Psychiatry

## 2018-12-07 ENCOUNTER — Other Ambulatory Visit: Payer: Self-pay

## 2018-12-07 ENCOUNTER — Ambulatory Visit (INDEPENDENT_AMBULATORY_CARE_PROVIDER_SITE_OTHER): Payer: 59 | Admitting: Psychiatry

## 2018-12-07 DIAGNOSIS — F411 Generalized anxiety disorder: Secondary | ICD-10-CM
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
## 2018-12-21 ENCOUNTER — Other Ambulatory Visit: Payer: Self-pay | Admitting: Obstetrics and Gynecology

## 2018-12-21 ENCOUNTER — Ambulatory Visit: Payer: Self-pay | Admitting: Psychiatry

## 2018-12-21 DIAGNOSIS — N644 Mastodynia: Secondary | ICD-10-CM

## 2018-12-28 ENCOUNTER — Other Ambulatory Visit: Payer: Self-pay

## 2018-12-28 ENCOUNTER — Ambulatory Visit
Admission: RE | Admit: 2018-12-28 | Discharge: 2018-12-28 | Disposition: A | Discharge status: Home / Self Care | Payer: Managed Care, Other (non HMO) | Source: Ambulatory Visit | Attending: Obstetrics and Gynecology | Admitting: Obstetrics and Gynecology

## 2018-12-28 DIAGNOSIS — N644 Mastodynia: Secondary | ICD-10-CM

## 2019-01-04 ENCOUNTER — Ambulatory Visit (INDEPENDENT_AMBULATORY_CARE_PROVIDER_SITE_OTHER): Payer: 59 | Admitting: Psychiatry

## 2019-01-04 DIAGNOSIS — F411 Generalized anxiety disorder: Secondary | ICD-10-CM | POA: Diagnosis not present
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
## 2019-01-17 ENCOUNTER — Other Ambulatory Visit: Payer: Self-pay | Admitting: Psychiatry

## 2019-01-17 DIAGNOSIS — F411 Generalized anxiety disorder: Secondary | ICD-10-CM

## 2019-01-17 DIAGNOSIS — F410 Panic disorder [episodic paroxysmal anxiety] without agoraphobia: Secondary | ICD-10-CM

## 2019-01-20 ENCOUNTER — Ambulatory Visit: Payer: 59 | Admitting: Psychiatry

## 2019-01-24 ENCOUNTER — Ambulatory Visit: Payer: Self-pay | Admitting: Psychiatry

## 2019-02-03 ENCOUNTER — Ambulatory Visit (INDEPENDENT_AMBULATORY_CARE_PROVIDER_SITE_OTHER): Payer: 59 | Admitting: Psychiatry

## 2019-02-03 DIAGNOSIS — F411 Generalized anxiety disorder: Secondary | ICD-10-CM
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
## 2019-02-17 ENCOUNTER — Other Ambulatory Visit: Payer: Self-pay

## 2019-02-17 ENCOUNTER — Ambulatory Visit (INDEPENDENT_AMBULATORY_CARE_PROVIDER_SITE_OTHER): Payer: 59 | Admitting: Psychiatry

## 2019-02-17 ENCOUNTER — Encounter: Payer: Self-pay | Admitting: Psychiatry

## 2019-02-17 DIAGNOSIS — F411 Generalized anxiety disorder: Secondary | ICD-10-CM | POA: Diagnosis not present
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
## 2019-04-12 ENCOUNTER — Ambulatory Visit (INDEPENDENT_AMBULATORY_CARE_PROVIDER_SITE_OTHER): Payer: 59 | Admitting: Psychiatry

## 2019-04-12 DIAGNOSIS — F411 Generalized anxiety disorder: Secondary | ICD-10-CM

## 2019-04-12 NOTE — Progress Notes (Signed)
[facility] Counselor/Therapist Progress Note  Patient ID: Bethany Terry, MRN: [id number],    Date: 04/12/2019  Time Spent: 51 minutes start 5:11PM and time 6:02 PM Virtual Visit via Telephone Note Connected with patient by a video enabled telemedicine/telehealth application or telephone, with their informed consent, and verified patient privacy and that I am speaking with the correct person using two identifiers. I discussed the limitations, risks, security and privacy concerns of performing psychotherapy and management service by telephone and the availability of in person appointments. I also discussed with the patient that there may be a patient responsible charge related to this service. The patient expressed understanding and agreed to proceed. I discussed the treatment planning with the patient. The patient was provided an opportunity to ask questions and all were answered. The patient agreed with the plan and demonstrated an understanding of the instructions. The patient was advised to call  our office if  symptoms worsen or feel they are in a crisis state and need immediate contact.   Therapist Location: home Patient Location: home    Treatment Type: Individual Therapy  Reported Symptoms: anxiety, irritability, overwhelmed, anger  Mental Status Exam:  Appearance:   NA     Behavior:  NA  Motor:  NA  Speech/Language:   Normal Rate  Affect:  NA  Mood:  sad  Thought process:  normal  Thought content:    WNL  Sensory/Perceptual disturbances:    WNL  Orientation:  oriented to person, place, time/date and situation  Attention:  Good  Concentration:  Good  Memory:  WNL  Fund of knowledge:   Good  Insight:    Good  Judgment:   Good  Impulse Control:  Good   Risk Assessment: Danger to Self:  No Self-injurious Behavior: No Danger to Others: No Duty to Warn:no Physical Aggression / Violence:No  Access to Firearms a concern: No  Gang Involvement:No   Subjective: Met  with patient through phone, due to patient having COVID 19 and not being able to connect to the [employer] system.  She stated she has ended 1 job and is trying to figure out what is next for her.  She shared that she is thinking about what she wants to do for her future.  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  She explained things have been hard for her because she is having trouble with finances.  She shared that she had to ask her parents for help with her rent.  Now she can't work now since she has [employer] and everyone that has been around her is having to get tested so she feels more guilt.  She shared she is having issues with her mother and she has concerns about her choices. She is worried about having to take care of her sooner than she thought which is also creating stress for.  Patient was encouraged to stay focused on taking things 1 step at a time.  Patient was reminded that she cannot look too far into the future were too far into the past but all she has is the present.  Ways to keep herself moving in a positive direction and dealing with the issues she can currently were discussed with patient and plans were developed in session.  Patient agreed to continue trying to find ways to get caught up in her head is much as she can on her class while she is at home  and then to look towards her future possibilities once her classes past.  Interventions: Cognitive Behavioral Therapy and Solution-Oriented/Positive Psychology  Diagnosis:   ICD-10-CM   1. Generalized anxiety disorder  F41.1     Plan: Patient is to use CBT and coping skills to decrease anxiety symptoms.  Patient is to stay focused on handling 1 thing at a time and staying focused on current situations.  Patient is to continue to find ways to release negative emotions in an appropriate manner. Long-term goal: Resolve the core conflict that is a source of anxiety Short-term goal: Describe current and  past experiences with specific fears prominent worries and anxiety symptoms including the impact on functioning and attempts to resolve it  [name], [facility]

## 2019-04-26 ENCOUNTER — Ambulatory Visit (INDEPENDENT_AMBULATORY_CARE_PROVIDER_SITE_OTHER): Payer: 59 | Admitting: Psychiatry

## 2019-04-26 ENCOUNTER — Other Ambulatory Visit: Payer: Self-pay

## 2019-04-26 DIAGNOSIS — F411 Generalized anxiety disorder: Secondary | ICD-10-CM

## 2019-04-26 NOTE — Progress Notes (Signed)
      [facility] Counselor/Therapist Progress Note  Patient ID: Bethany Terry, MRN: [id number],    Date: 04/26/2019  Time Spent: 52 minutes start time 3:05 PM end time 3:57 PM  Treatment Type: Individual Therapy  Reported Symptoms: anxiety, difficulty completing tasks, focusing issues, fatigue  Mental Status Exam:  Appearance:   Casual and Neat     Behavior:  Sharing  Motor:  Normal  Speech/Language:   Normal Rate  Affect:  Appropriate  Mood:  normal  Thought process:  normal  Thought content:    WNL  Sensory/Perceptual disturbances:    WNL  Orientation:  oriented to person, place, time/date and situation  Attention:  Good  Concentration:  Good  Memory:  WNL  Fund of knowledge:   Good  Insight:    Good  Judgment:   Good  Impulse Control:  Good   Risk Assessment: Danger to Self:  No Self-injurious Behavior: No Danger to Others: No Duty to Warn:no Physical Aggression / Violence:No  Access to Firearms a concern: No  Gang Involvement:No   Subjective: Patient was present for session.  She shared she is still trying to recover from COVID.  She shared that she and her boyfriend have been struggling due to all the financial stress that they are under.  She recognized that both of them have been using poor coping skills.  Was allowed time to discuss the things that been going on that have created even more stress for her boyfriend and as well as herself.  Patient explained she is continuing to struggle with her mom and some of her choices.  She is also starting to feel the responsibility of having to be a caretaker for her mother and father in the future.  Patient was encouraged to use backup and focus on 1 things at a time.  She was encouraged to find ways to release her anxiety appropriately through exercise or journaling.  Also finding ways for she and her boyfriend to talk more about triggers and things that are going on and ways that they can take care of each other.  Patient  explained that she is overwhelmed with trying to deal with finances and go to school.  She was encouraged to focus on 1 thing at a time and trying to accomplish tasks throughout the day rather than looking at all this time is that she has to do and getting overwhelmed and shutting down.  The importance of just sitting down and making small steps was addressed with patient.  Interventions: Cognitive Behavioral Therapy and Solution-Oriented/Positive Psychology  Diagnosis:   ICD-10-CM   1. Generalized anxiety disorder  F41.1     Plan: Patient is to use CBT and coping skills to decrease anxiety symptoms.  Patient is to work on finding releases for her physically through exercise and boxing.  Patient is to communicate more with her boyfriend about her concerns and why things can be resolved appropriately.  Patient is to break things down into manageable pieces and take on 1 thing at a time. Long-term goal: Resolve the core conflict that is the source of anxiety Short-term goal: Describe current and past experiences with specific fears prominent worries and anxiety symptoms including their impact on functioning and attempts to resolve it  [name], [facility]

## 2019-05-10 ENCOUNTER — Ambulatory Visit: Payer: 59 | Admitting: Psychiatry

## 2019-05-25 ENCOUNTER — Other Ambulatory Visit: Payer: Self-pay

## 2019-05-25 ENCOUNTER — Ambulatory Visit (INDEPENDENT_AMBULATORY_CARE_PROVIDER_SITE_OTHER): Payer: 59 | Admitting: Psychiatry

## 2019-05-25 DIAGNOSIS — F411 Generalized anxiety disorder: Secondary | ICD-10-CM | POA: Diagnosis not present
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
## 2019-08-10 ENCOUNTER — Ambulatory Visit (INDEPENDENT_AMBULATORY_CARE_PROVIDER_SITE_OTHER): Payer: 59 | Admitting: Psychiatry

## 2019-08-10 ENCOUNTER — Other Ambulatory Visit: Payer: Self-pay

## 2019-08-10 DIAGNOSIS — F411 Generalized anxiety disorder: Secondary | ICD-10-CM | POA: Diagnosis not present
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
## 2019-08-17 ENCOUNTER — Ambulatory Visit (INDEPENDENT_AMBULATORY_CARE_PROVIDER_SITE_OTHER): Payer: 59 | Admitting: Psychiatry

## 2019-08-17 ENCOUNTER — Other Ambulatory Visit: Payer: Self-pay

## 2019-08-17 DIAGNOSIS — F411 Generalized anxiety disorder: Secondary | ICD-10-CM | POA: Diagnosis not present
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
## 2019-08-31 ENCOUNTER — Ambulatory Visit (INDEPENDENT_AMBULATORY_CARE_PROVIDER_SITE_OTHER): Payer: 59 | Admitting: Psychiatry

## 2019-08-31 ENCOUNTER — Other Ambulatory Visit: Payer: Self-pay

## 2019-08-31 DIAGNOSIS — F411 Generalized anxiety disorder: Secondary | ICD-10-CM
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
## 2019-09-07 ENCOUNTER — Encounter (HOSPITAL_COMMUNITY): Payer: Self-pay

## 2019-09-07 ENCOUNTER — Encounter: Payer: Self-pay | Admitting: Psychiatry

## 2019-09-07 ENCOUNTER — Other Ambulatory Visit: Payer: Self-pay

## 2019-09-07 ENCOUNTER — Ambulatory Visit (INDEPENDENT_AMBULATORY_CARE_PROVIDER_SITE_OTHER): Payer: 59 | Admitting: Psychiatry

## 2019-09-07 VITALS — Ht 61.0 in | Wt 191.0 lb

## 2019-09-07 DIAGNOSIS — F902 Attention-deficit hyperactivity disorder, combined type: Secondary | ICD-10-CM | POA: Diagnosis not present

## 2019-09-07 DIAGNOSIS — F341 Dysthymic disorder: Secondary | ICD-10-CM

## 2019-09-07 DIAGNOSIS — F411 Generalized anxiety disorder: Secondary | ICD-10-CM

## 2019-09-07 DIAGNOSIS — F410 Panic disorder [episodic paroxysmal anxiety] without agoraphobia: Secondary | ICD-10-CM

## 2019-09-07 DIAGNOSIS — R0781 Pleurodynia: Secondary | ICD-10-CM | POA: Diagnosis not present

## 2019-09-07 DIAGNOSIS — R079 Chest pain, unspecified: Secondary | ICD-10-CM | POA: Diagnosis present

## 2019-09-07 DIAGNOSIS — Z79899 Other long term (current) drug therapy: Secondary | ICD-10-CM | POA: Insufficient documentation

## 2019-09-07 DIAGNOSIS — F5105 Insomnia due to other mental disorder: Secondary | ICD-10-CM

## 2019-09-07 MED ORDER — AMPHETAMINE-DEXTROAMPHET ER 30 MG PO CP24: 30.0000 mg | ORAL_CAPSULE | Freq: Every day | ORAL | 0 refills | Status: DC

## 2019-09-07 MED ORDER — ALPRAZOLAM 2 MG PO TABS: 2.0000 mg | ORAL_TABLET | Freq: Two times a day (BID) | ORAL | 3 refills | Status: DC | PRN
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
## 2019-09-07 NOTE — ED Triage Notes (Signed)
Pt complains of a stabbing chest pain on the left side of her chest for about two hours Pt denies any nausea, vomiting or other pain

## 2019-09-08 ENCOUNTER — Emergency Department (HOSPITAL_COMMUNITY): Payer: Managed Care, Other (non HMO)

## 2019-09-08 ENCOUNTER — Emergency Department (HOSPITAL_COMMUNITY)
Admission: EM | Admit: 2019-09-08 | Discharge: 2019-09-08 | Disposition: A | Discharge status: Home / Self Care | Payer: Managed Care, Other (non HMO) | Attending: Emergency Medicine | Admitting: Emergency Medicine

## 2019-09-08 DIAGNOSIS — R0781 Pleurodynia: Secondary | ICD-10-CM

## 2019-09-08 LAB — BASIC METABOLIC PANEL
Anion gap: 14 (ref 5–15)
BUN: 14 mg/dL (ref 6–20)
CO2: 23 mmol/L (ref 22–32)
Calcium: 9.5 mg/dL (ref 8.9–10.3)
Chloride: 103 mmol/L (ref 98–111)
Creatinine, Ser: 0.86 mg/dL (ref 0.44–1.00)
GFR calc Af Amer: >60 mL/min (ref >60)
GFR calc non Af Amer: >60 mL/min (ref >60)
Glucose, Bld: 121 mg/dL — ABNORMAL HIGH (ref 70–99)
Potassium: 3.6 mmol/L (ref 3.5–5.1)
Sodium: 140 mmol/L (ref 135–145)

## 2019-09-08 LAB — CBC
HCT: 41.5 % (ref 36.0–46.0)
Hemoglobin: 14.3 g/dL (ref 12.0–15.0)
MCH: 28 pg (ref 26.0–34.0)
MCHC: 34.5 g/dL (ref 30.0–36.0)
MCV: 81.2 fL (ref 80.0–100.0)
Platelets: 270 10*3/uL (ref 150–400)
RBC: 5.11 MIL/uL (ref 3.87–5.11)
RDW: 11.7 % (ref 11.5–15.5)
WBC: 9.7 10*3/uL (ref 4.0–10.5)
nRBC: 0 % (ref 0.0–0.2)

## 2019-09-08 LAB — TROPONIN I (HIGH SENSITIVITY): Troponin I (High Sensitivity): <2 ng/L (ref <18)

## 2019-09-08 LAB — I-STAT BETA HCG BLOOD, ED (MC, WL, AP ONLY): I-stat hCG, quantitative: <5 m[IU]/mL (ref <5)

## 2019-09-08 MED ORDER — NAPROXEN 500 MG PO TABS: ORAL_TABLET | ORAL | 0 refills | Status: DC

## 2019-09-08 MED ORDER — NAPROXEN 500 MG PO TABS
500.0000 mg | ORAL_TABLET | Freq: Once | ORAL | Status: AC
  Administered 2019-09-08: 500 mg via ORAL
  Filled 2019-09-08: qty 1

## 2019-09-08 NOTE — ED Provider Notes (Signed)
[facility]-EMERGENCY DEPT Provider Note: Lowella Dell, MD, FACEP  CSN: [id number] MRN: [id number] ARRIVAL: 09/07/19 at 2314 ROOM: WA04/WA04   CHIEF COMPLAINT  Chest Pain   HISTORY OF PRESENT ILLNESS  09/08/19 3:29 AM Bethany Terry is a 19 y.o. female with a history of recurrent pleuritic chest pain.  She is here with sharp chest pain that began about 2 hours prior to arrival.  It is located in her left upper and left lower anterior chest.  She rates the pain as an 8 out of 10 and it is worse with expiration.  She is taken ibuprofen without relief.  She has taken other NSAIDs in the past with some relief.  She denies any shortness of breath, nausea, vomiting, cough or fever.   Past Medical History:  Diagnosis Date  . Anxiety   . Arthritis   . Depression   . Insomnia     History reviewed. No pertinent surgical history.  Family History  Adopted: Yes    Social History   Tobacco Use  . Smoking status: Never Smoker  . Smokeless tobacco: Never Used  Vaping Use  . Vaping Use: Never used  Substance Use Topics  . Alcohol use: No  . Drug use: No    Prior to Admission medications   Medication Sig Start Date End Date Taking? Authorizing Provider  albuterol (VENTOLIN HFA) 108 (90 Base) MCG/ACT inhaler Inhale 2 puffs into the lungs every 6 (six) hours as needed for wheezing. 10/10/17   Gerhard Munch, MD  alprazolam Prudy Feeler) 2 MG tablet Take 1 tablet (2 mg total) by mouth 2 (two) times daily as needed for sleep or anxiety (Panic attack). 09/07/19   Chauncey Mann, MD  amphetamine-dextroamphetamine (ADDERALL XR) 30 MG 24 hr capsule Take 1 capsule (30 mg total) by mouth daily after breakfast. 09/07/19 10/07/19  Chauncey Mann, MD  amphetamine-dextroamphetamine (ADDERALL XR) 30 MG 24 hr capsule Take 1 capsule (30 mg total) by mouth daily after breakfast. 10/07/19 11/06/19  Chauncey Mann, MD  amphetamine-dextroamphetamine (ADDERALL XR) 30 MG 24 hr capsule Take 1 capsule (30 mg total) by  mouth daily after breakfast. 11/06/19 12/06/19  Chauncey Mann, MD  naproxen (NAPROSYN) 500 MG tablet Take 1 tablet twice daily as needed for chest wall pain. 09/08/19   Junie Avilla, MD  propranolol (INDERAL) 20 MG tablet TAKE 1 TABLET (20 MG TOTAL) BY MOUTH 3 (THREE) TIMES DAILY AS NEEDED (PANIC OR ANXIETY). 01/19/19   Chauncey Mann, MD    Allergies Patient has no known allergies.   REVIEW OF SYSTEMS  Negative except as noted here or in the History of Present Illness.   PHYSICAL EXAMINATION  Initial Vital Signs Blood pressure 122/87, pulse 80, temperature 99 F (37.2 C), temperature source Oral, resp. rate 13, height 5' (1.524 m), weight 86.2 kg, last menstrual period 09/07/2019, SpO2 100 %.  Examination General: Well-developed, well-nourished female in no acute distress; appearance consistent with age of record HENT: normocephalic; atraumatic Eyes: pupils equal, round and reactive to light; extraocular muscles intact Neck: supple Heart: regular rate and rhythm Lungs: clear to auscultation bilaterally Chest: Tender at sites of pain noted in HPI Abdomen: soft; nondistended; nontender; bowel sounds present Extremities: No deformity; full range of motion; pulses normal Neurologic: Awake, alert and oriented; motor function intact in all extremities and symmetric; no facial droop Skin: Warm and dry Psychiatric: Normal mood and affect   RESULTS  Summary of this visit's results, reviewed and interpreted by myself:  EKG Interpretation  Date/Time:  Wednesday September 07 2019 23:35:49 EDT Ventricular Rate:  85 PR Interval:    QRS Duration: 80 QT Interval:  353 QTC Calculation: 420 R Axis:   55 Text Interpretation: Sinus rhythm Normal ECG No previous ECGs available Confirmed by Cosandra Plouffe, Jonny Ruiz ([id number]) on 09/08/2019 3:27:34 AM      Laboratory Studies: Results for orders placed or performed during the hospital encounter of 09/08/19 (from the past 24 hour(s))  Basic metabolic  panel     Status: Abnormal   Collection Time: 09/08/19 12:06 AM  Result Value Ref Range   Sodium 140 135 - 145 mmol/L   Potassium 3.6 3.5 - 5.1 mmol/L   Chloride 103 98 - 111 mmol/L   CO2 23 22 - 32 mmol/L   Glucose, Bld 121 (H) 70 - 99 mg/dL   BUN 14 6 - 20 mg/dL   Creatinine, Ser 3.08 0.44 - 1.00 mg/dL   Calcium 9.5 8.9 - 65.7 mg/dL   GFR calc non Af Amer >60 >60 mL/min   GFR calc Af Amer >60 >60 mL/min   Anion gap 14 5 - 15  CBC     Status: None   Collection Time: 09/08/19 12:06 AM  Result Value Ref Range   WBC 9.7 4.0 - 10.5 K/uL   RBC 5.11 3.87 - 5.11 MIL/uL   Hemoglobin 14.3 12.0 - 15.0 g/dL   HCT 84.6 36 - 46 %   MCV 81.2 80.0 - 100.0 fL   MCH 28.0 26.0 - 34.0 pg   MCHC 34.5 30.0 - 36.0 g/dL   RDW 96.2 95.2 - 84.1 %   Platelets 270 150 - 400 K/uL   nRBC 0.0 0.0 - 0.2 %  Troponin I (High Sensitivity)     Status: None   Collection Time: 09/08/19 12:06 AM  Result Value Ref Range   Troponin I (High Sensitivity) <2 <18 ng/L  I-Stat beta hCG blood, ED     Status: None   Collection Time: 09/08/19 12:11 AM  Result Value Ref Range   I-stat hCG, quantitative <5.0 <5 mIU/mL   Comment 3           Imaging Studies: DG Chest 2 View  Result Date: 09/08/2019 CLINICAL DATA:  Sudden onset mid chest pain EXAM: CHEST - 2 VIEW COMPARISON:  Radiograph 11/21/2005 FINDINGS: Accounting for body habitus, the lungs are clear. No consolidation, features of edema, pneumothorax, or effusion. Pulmonary vascularity is normally distributed. The cardiomediastinal contours are unremarkable. No acute osseous or soft tissue abnormality. IMPRESSION: No acute cardiopulmonary abnormality. Electronically Signed   By: Kreg Shropshire M.D.   On: 09/08/2019 00:29    ED COURSE and MDM  Nursing notes, initial and subsequent vitals signs, including pulse oximetry, reviewed and interpreted by myself.  Vitals:   09/07/19 2334 09/08/19 0330  BP: 122/87 (!) 113/55  Pulse: 80 61  Resp: 13 14  Temp: 99 F (37.2 C)    TempSrc: Oral   SpO2: 100% 100%  Weight: 86.2 kg   Height: 5' (1.524 m)    Medications  naproxen (NAPROSYN) tablet 500 mg (has no administration in time range)    The patient's pain is pleuritic and recurrent.  I see no evidence of cardiac etiology, her presentation is not typical for cardiac and she has no risk factors.  She also has no risk factors for pulmonary embolism.  We will treat with naproxen.  PROCEDURES  Procedures   ED DIAGNOSES     ICD-10-CM  1. Pleuritic chest pain  R07.81        Paula Libra, MD 09/08/19 [phone]

## 2019-09-14 ENCOUNTER — Ambulatory Visit (INDEPENDENT_AMBULATORY_CARE_PROVIDER_SITE_OTHER): Payer: 59 | Admitting: Psychiatry

## 2019-09-14 ENCOUNTER — Other Ambulatory Visit: Payer: Self-pay

## 2019-09-14 DIAGNOSIS — F411 Generalized anxiety disorder: Secondary | ICD-10-CM
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
## 2019-09-28 ENCOUNTER — Encounter: Payer: Self-pay | Admitting: Psychiatry

## 2019-09-28 ENCOUNTER — Ambulatory Visit (INDEPENDENT_AMBULATORY_CARE_PROVIDER_SITE_OTHER): Payer: 59 | Admitting: Psychiatry

## 2019-09-28 ENCOUNTER — Other Ambulatory Visit: Payer: Self-pay

## 2019-09-28 VITALS — BP 119/76 | HR 77

## 2019-09-28 DIAGNOSIS — F902 Attention-deficit hyperactivity disorder, combined type: Secondary | ICD-10-CM | POA: Diagnosis not present

## 2019-09-28 DIAGNOSIS — F410 Panic disorder [episodic paroxysmal anxiety] without agoraphobia: Secondary | ICD-10-CM

## 2019-09-28 DIAGNOSIS — F411 Generalized anxiety disorder: Secondary | ICD-10-CM
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
## 2019-10-12 ENCOUNTER — Other Ambulatory Visit: Payer: Self-pay

## 2019-10-12 ENCOUNTER — Ambulatory Visit (INDEPENDENT_AMBULATORY_CARE_PROVIDER_SITE_OTHER): Payer: 59 | Admitting: Psychiatry

## 2019-10-12 DIAGNOSIS — F411 Generalized anxiety disorder: Secondary | ICD-10-CM
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
## 2019-10-26 ENCOUNTER — Encounter: Payer: Self-pay | Admitting: Psychiatry

## 2019-11-02 ENCOUNTER — Other Ambulatory Visit: Payer: Self-pay

## 2019-11-02 ENCOUNTER — Ambulatory Visit (INDEPENDENT_AMBULATORY_CARE_PROVIDER_SITE_OTHER): Payer: 59 | Admitting: Psychiatry

## 2019-11-02 DIAGNOSIS — F411 Generalized anxiety disorder: Secondary | ICD-10-CM

## 2019-11-02 NOTE — Progress Notes (Signed)
[facility] Counselor/Therapist Progress Note  Patient ID: Bethany Terry, MRN: [id number],    Date: 11/02/2019  Time Spent: 49 minutes start time 3:01 PM end time 3:50 PM  Treatment Type: Individual Therapy  Reported Symptoms: anxiety, sadness  Mental Status Exam:  Appearance:   Casual and Neat     Behavior:  Appropriate  Motor:  Normal  Speech/Language:   Normal Rate  Affect:  Appropriate  Mood:  normal  Thought process:  normal  Thought content:    WNL  Sensory/Perceptual disturbances:    WNL  Orientation:  oriented to person, place, time/date and situation  Attention:  Good  Concentration:  Good  Memory:  WNL  Fund of knowledge:   Good  Insight:    Good  Judgment:   Good  Impulse Control:  Good   Risk Assessment: Danger to Self:  No Self-injurious Behavior: No Danger to Others: No Duty to Warn:no Physical Aggression / Violence:No  Access to Firearms a concern: No  Gang Involvement:No   Subjective: Patient was present for session.  She shared that she has decided that  She may need to date other people.  She was encouraged to know what the consequences could be prior to making the decision.  Patient explained she has already communicated her concerns and the possibility of dating others with her boyfriend.  She shared at this point she needs to do it just for herself to make sure she is making a right decision for her future.  She went on to share that her sister called her and they were able to recognize that their mother has boundary issues and they are both concerned and have anxiety about it.  Patient went on to share she is also concerned about her mother's substance use.  She explained she feels is getting worse and is fearful that something may happen to her mother.  Patient acknowledged at this point she feels she is the only one that truly knows what is going on.  Discussed the importance of living with no regrets at the same time acknowledging she cannot fix  her mother.  Patient was encouraged to think through what she wanted to do with the situation so she was not caring the burden on her own.  Patient decided she would go after session and check on her mother and if she found that she was drinking she is going to notify her father and brother about the situation.  She shared she is not sure what they would do but at least they could try and help her deal with the situation.  Patient also shared she is going to continue to encourage her mother to get into treatment and to find some other ways to take care of herself.  Patient was encouraged to use her CBT skills to remind herself that she is not responsible for her mother she can only help her to try and make positive decisions.  Patient shared she is also working with a patient who has similar tendencies as her mother so that can be very triggering for her.  Reminded her she has to have the same boundaries with the patient and continue giving her patients family members information regarding her behavior and not trying to fix things.  Patient was encouraged to feel positive about what she is learning and the fact that she is working on a healthy perspective currently.  Patient was reminded of her box and to use that visual whenever she  needs to.  Interventions: Cognitive Behavioral Therapy and Solution-Oriented/Positive Psychology  Diagnosis:   ICD-10-CM   1. Generalized anxiety disorder  F41.1     Plan: Patient is to use CBT and coping skills to decrease anxiety symptoms.  Patient is to remind herself that she can only help her mother she cannot fix her.  Patient is to inform brother and father of what is going on with her mother so they can help her deal with things.  Patient is to set limits with her mother and her patient's. Long-term goal: Resolve the core conflict that is the source of anxiety Short-term goal: Describe current and past experiences with specific fears prominent worries and anxiety  symptoms including their impact on functioning and attempts to resolve it  [name], [facility]

## 2019-11-16 ENCOUNTER — Ambulatory Visit: Payer: 59 | Admitting: Psychiatry

## 2019-12-07 ENCOUNTER — Encounter: Payer: Self-pay | Admitting: Psychiatry

## 2019-12-07 ENCOUNTER — Ambulatory Visit (INDEPENDENT_AMBULATORY_CARE_PROVIDER_SITE_OTHER): Payer: 59 | Admitting: Psychiatry

## 2019-12-07 ENCOUNTER — Other Ambulatory Visit: Payer: Self-pay

## 2019-12-07 DIAGNOSIS — F5105 Insomnia due to other mental disorder: Secondary | ICD-10-CM

## 2019-12-07 DIAGNOSIS — F411 Generalized anxiety disorder: Secondary | ICD-10-CM | POA: Diagnosis not present

## 2019-12-07 DIAGNOSIS — F410 Panic disorder [episodic paroxysmal anxiety] without agoraphobia: Secondary | ICD-10-CM | POA: Diagnosis not present

## 2019-12-07 DIAGNOSIS — F902 Attention-deficit hyperactivity disorder, combined type: Secondary | ICD-10-CM

## 2019-12-07 MED ORDER — AMPHETAMINE-DEXTROAMPHET ER 30 MG PO CP24: 30.0000 mg | ORAL_CAPSULE | Freq: Every day | ORAL | 0 refills | Status: DC

## 2019-12-07 MED ORDER — ALPRAZOLAM 2 MG PO TABS: ORAL_TABLET | ORAL | 3 refills | Status: DC

## 2019-12-07 NOTE — Progress Notes (Signed)
Bethany Terry [id number] Feb 12, 2000 19 y.o.  Subjective:   Patient ID:  Bethany Terry is a 19 y.o. (DOB 08-08-2000) female.  Chief Complaint:  Chief Complaint  Patient presents with  . Follow-up    ADHD, Insomnia, anxiety, and depression    HPI Bethany Terry presents to the office today for follow-up of anxiety, insomnia, ADHD, and depression . She reports episodic anxiety with moments when anxiety is higher than expected. She reports that she has been critical of herself in terms of body image and school work. Notices some worry with multiple transitions and stressors. She reports that when she cries it will lead to panic, so she tries to avoid crying. Has some hyper-ventilating and numbness in arms and legs when having increased anxiety. Reports depressed mood.  She reports that she was trying to decrease Xanax and noticed worsening insomnia. She reports that she would like to take less Xanax. She reports that she tried to gradually reduce Xanax from 2 mg to 1 mg for a week, to 1/2 mg for a week. She reports that she had periods of over-sleeping and then not sleeping. She went back to taking Xanax 2 mg po QHS and is now getting an adequate amount of sleep. Energy has been low physically and mentally. Motivation has been very low. She reports that motivation is typically impulsive. Reports that most of her activity is driven by anxiety and anger. She had some recent impulsive spending. Denies any other impulsive or risky behavior. Appetite has been increased and attributes this to new OCP. Concentration has been "here and there." Reports concentration varies depending on mood. Denies SI.  Recently ended relationship with partner of 6 years.  Reports that school has been stressful. 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 Has been working 12-16 hour shifts.   Had started a new OCP and then stopped it due to wt gain and  then resumed previous OCP. She reports that she has been "more emotional" with change in OCP.She has apt today to have Nexplanon inserted today.   Has been trying to help take care of her mother.   PHQ2-9     Office Visit from 02/17/2017 in Primary Care at [facility] Visit from 11/19/2016 in Primary Care at [facility] Visit from 10/08/2016 in Primary Care at [facility] Visit from 02/21/2015 in Primary Care at [facility] Visit from 01/19/2015 in Primary Care at [facility] Total Score 0 0 0 0 0       Review of Systems:  Review of Systems  Gastrointestinal: Positive for abdominal distention.  Musculoskeletal: Negative for gait problem.  Psychiatric/Behavioral:       Please refer to HPI    Medications: I have reviewed the patient's current medications.  Current Outpatient Medications  Medication Sig Dispense Refill  . albuterol (VENTOLIN HFA) 108 (90 Base) MCG/ACT inhaler Inhale 2 puffs into the lungs every 6 (six) hours as needed for wheezing. 1 Inhaler 0  . alprazolam (XANAX) 2 MG tablet Take 1 tablet at bedtime and 1/2-1 tablet as needed for anxiety 45 tablet 3  . Norgestimate-Ethinyl Estradiol Triphasic 0.18/0.215/0.25 MG-35 MCG tablet Take 1 tablet by mouth daily.    Melene Muller ON 01/04/2020] amphetamine-dextroamphetamine (ADDERALL XR) 30 MG 24 hr capsule Take 1 capsule (30 mg total) by mouth daily after breakfast. 30 capsule 0  . [START ON 01/04/2020] amphetamine-dextroamphetamine (ADDERALL XR) 30 MG 24 hr capsule Take 1  capsule (30 mg total) by mouth daily after breakfast. 30 capsule 0  . [START ON 02/01/2020] amphetamine-dextroamphetamine (ADDERALL XR) 30 MG 24 hr capsule Take 1 capsule (30 mg total) by mouth daily after breakfast. 30 capsule 0  . Fluticasone-Salmeterol (ADVAIR) 250-50 MCG/DOSE AEPB Inhale into the lungs. (Patient not taking: Reported on 12/07/2019)    . propranolol (INDERAL) 20 MG tablet TAKE 1 TABLET (20 MG TOTAL) BY MOUTH 3 (THREE) TIMES DAILY AS NEEDED  (PANIC OR ANXIETY). (Patient not taking: Reported on 09/28/2019) 270 tablet 0   No current facility-administered medications for this visit.    Medication Side Effects: None  Allergies:  Allergies  Allergen Reactions  . Naproxen Rash    Past Medical History:  Diagnosis Date  . Anxiety   . Arthritis   . Depression   . Insomnia     Family History  Adopted: Yes    Social History   Socioeconomic History  . Marital status: Significant Other    Spouse name: Not on file  . Number of children: Not on file  . Years of education: Not on file  . Highest education level: Not on file  Occupational History  . Not on file  Tobacco Use  . Smoking status: Never Smoker  . Smokeless tobacco: Never Used  Vaping Use  . Vaping Use: Never used  Substance and Sexual Activity  . Alcohol use: No  . Drug use: No  . Sexual activity: Yes    Birth control/protection: None  Other Topics Concern  . Not on file  Social History Narrative   02/25/2012 Bethany Terry was born in [place], and adopted at 11 months by her current family. She lives with her maternal grandparents, father, mother, and 12 year old brother. She began this year in the eighth grade at the [employer], but plans to transfer to [employer]. She enjoys hanging out with her friends, playing basketball and volleyball for her church leagues. 02/25/2012 Bethany   Current school [employer] in the 8th grade.   Social Determinants of Health   Financial Resource Strain:   . Difficulty of Paying Living Expenses: Not on file  Food Insecurity:   . Worried About Programme researcher, broadcasting/film/video in the Last Year: Not on file  . Ran Out of Food in the Last Year: Not on file  Transportation Needs:   . Lack of Transportation (Medical): Not on file  . Lack of Transportation (Non-Medical): Not on file  Physical Activity:   . Days of Exercise per Week: Not on file  . Minutes of Exercise per Session: Not on file   Stress:   . Feeling of Stress : Not on file  Social Connections:   . Frequency of Communication with Friends and Family: Not on file  . Frequency of Social Gatherings with Friends and Family: Not on file  . Attends Religious Services: Not on file  . Active Member of Clubs or Organizations: Not on file  . Attends Banker Meetings: Not on file  . Marital Status: Not on file  Intimate Partner Violence:   . Fear of Current or Ex-Partner: Not on file  . Emotionally Abused: Not on file  . Physically Abused: Not on file  . Sexually Abused: Not on file    Past Medical History, Surgical history, Social history, and Family history were reviewed and updated as appropriate.   Please see review of systems for further details on the patient's review from today.  Objective:   Physical Exam:  There were no vitals taken for this visit.  Physical Exam Constitutional:      General: She is not in acute distress. Musculoskeletal:        General: No deformity.  Neurological:     Mental Status: She is alert and oriented to person, place, and time.     Coordination: Coordination normal.  Psychiatric:        Attention and Perception: Attention and perception normal. She does not perceive auditory or visual hallucinations.        Mood and Affect: Mood is anxious. Mood is not depressed. Affect is not labile, blunt, angry or inappropriate.        Speech: Speech normal.        Behavior: Behavior normal.        Thought Content: Thought content normal. Thought content is not paranoid or delusional. Thought content does not include homicidal or suicidal ideation. Thought content does not include homicidal or suicidal plan.        Cognition and Memory: Cognition and memory normal.        Judgment: Judgment normal.     Comments: Insight intact     Lab Review:     Component Value Date/Time   NA 140 09/08/2019 0006   K 3.6 09/08/2019 0006   CL 103 09/08/2019 0006   CO2 23 09/08/2019  0006   GLUCOSE 121 (H) 09/08/2019 0006   BUN 14 09/08/2019 0006   CREATININE 0.86 09/08/2019 0006   CALCIUM 9.5 09/08/2019 0006   PROT 7.7 10/10/2017 1811   ALBUMIN 4.2 10/10/2017 1811   AST 23 10/10/2017 1811   ALT 18 10/10/2017 1811   ALKPHOS 57 10/10/2017 1811   BILITOT 0.7 10/10/2017 1811   GFRNONAA >60 09/08/2019 0006   GFRAA >60 09/08/2019 0006       Component Value Date/Time   WBC 9.7 09/08/2019 0006   RBC 5.11 09/08/2019 0006   HGB 14.3 09/08/2019 0006   HCT 41.5 09/08/2019 0006   PLT 270 09/08/2019 0006   MCV 81.2 09/08/2019 0006   MCH 28.0 09/08/2019 0006   MCHC 34.5 09/08/2019 0006   RDW 11.7 09/08/2019 0006    No results found for: POCLITH, LITHIUM   No results found for: PHENYTOIN, PHENOBARB, VALPROATE, CBMZ   .res Assessment: Plan:   Discussed resuming previous medications since she has had worsening s/s with attempting to reduce medication. Discussed continuing Xanax 2 mg at bedtime while she is in school and then considering dose reduction and change in sleep medication in the future. Discussed that more gradual dose reduction, ie. Decreasing by 0.25 mg q 2-3 weeks mat help minimize risk of discontinuation s/s. Continue Xanax 2 mg 1 tab po QHS and 1/2-1 tab po qd prn anxiety. Will resume Adderall XR 30 mg for ADHD.  Recommend continuing therapy with [name], [facility].  Pt to follow-up in 2 months or sooner if clinically indicated.  Patient advised to contact office with any questions, adverse effects, or acute worsening in signs and symptoms.   Bethany Terry was seen today for follow-up.  Diagnoses and all orders for this visit:  Generalized anxiety disorder -     alprazolam (XANAX) 2 MG tablet; Take 1 tablet at bedtime and 1/2-1 tablet as needed for anxiety  Panic disorder -     alprazolam (XANAX) 2 MG tablet; Take 1 tablet at bedtime and 1/2-1 tablet as needed for anxiety  Insomnia disorder, with non-sleep disorder mental comorbidity,  episodic -      alprazolam (XANAX) 2 MG tablet; Take 1 tablet at bedtime and 1/2-1 tablet as needed for anxiety  Attention deficit hyperactivity disorder (ADHD), combined type, moderate -     amphetamine-dextroamphetamine (ADDERALL XR) 30 MG 24 hr capsule; Take 1 capsule (30 mg total) by mouth daily after breakfast. -     amphetamine-dextroamphetamine (ADDERALL XR) 30 MG 24 hr capsule; Take 1 capsule (30 mg total) by mouth daily after breakfast. -     amphetamine-dextroamphetamine (ADDERALL XR) 30 MG 24 hr capsule; Take 1 capsule (30 mg total) by mouth daily after breakfast.     Please see After Visit Summary for patient specific instructions.  Future Appointments  Date Time Provider Department Center  12/14/2019  3:00 PM [facility] [place] None  02/08/2020 10:00 AM Corie Chiquitoarter, Jackquelyn Sundberg, PMHNP CP-CP None    No orders of the defined types were placed in this encounter.   -------------------------------

## 2019-12-14 ENCOUNTER — Ambulatory Visit (INDEPENDENT_AMBULATORY_CARE_PROVIDER_SITE_OTHER): Payer: 59 | Admitting: Psychiatry

## 2019-12-14 ENCOUNTER — Other Ambulatory Visit: Payer: Self-pay

## 2019-12-14 DIAGNOSIS — F411 Generalized anxiety disorder: Secondary | ICD-10-CM

## 2019-12-14 NOTE — Progress Notes (Signed)
[facility] Counselor/Therapist Progress Note  Patient ID: Bethany Terry, MRN: [id number],    Date: 12/14/2019  Time Spent: 48 minutes start time 3:12 PM end time 4 PM  Treatment Type: Individual Therapy  Reported Symptoms: sadness, anxiety, irritability  Mental Status Exam:  Appearance:   Casual and Neat     Behavior:  Appropriate  Motor:  Normal  Speech/Language:   Normal Rate  Affect:  Appropriate  Mood:  sad  Thought process:  normal  Thought content:    WNL  Sensory/Perceptual disturbances:    WNL  Orientation:  oriented to person,place, time, day  Attention:  Good  Concentration:  Good  Memory:  WNL  Fund of knowledge:   Good  Insight:    Good  Judgment:   Good  Impulse Control:  Good   Risk Assessment: Danger to Self:  No Self-injurious Behavior: No Danger to Others: No Duty to Warn:no Physical Aggression / Violence:No  Access to Firearms a concern: No  Gang Involvement:No   Subjective: Patient was present for session.  She reported that she decided to break up with her boyfriend and now she is supposed to move in with her mother but she is not sure.  She is planning on going back to school. She realizes living with her mother will take a toll on her mental health. She is trying to focus on herself for a while.  Patient explained that due to a cold she is on quarantine for the next week.  She shared this would be the best time for her to think through what she wants to do with the move.  Patient was encouraged to think about what it is she does want and what would make her situation most helpful.  Patient decided that she really would be best to move in with her father.  Discussed ways that she could go ahead and communicate with him about what she needs and start working on helping him get his apartment ready for her to move into.  Patient shared that it will be hard to be without her boyfriend but she just does not feel at this point it is healthy for her to stay  in the situation.  She is worried that her mother will have a hard time with her moving in with her father but she feels it is still the best thing for her to do at this time.  Was encouraged to think through ways that she can focus on her self-care and take time to accomplish the goals that she wants to for her future.  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  Discussed the fact that she has always had the anxiety of shots.  She reported that she was able to get her flu vaccination without any trouble so she feels that work from EMDR has been helpful and she is ready to start the career that she wants.  Patient was encouraged to feel good about the work she is done to journal and continue working on exercising regularly to move in a positive direction.  Interventions: Cognitive Behavioral Therapy and Solution-Oriented/Positive Psychology  Diagnosis:   ICD-10-CM   1. Generalized anxiety disorder  F41.1     Plan: Patient is to use CBT and coping skills to decrease anxiety symptoms.  Patient is to talk with her father about moving in with him and what she would need to make  the situation functional.  Patient is to start the moving progress to either her mother or her father's house.  Patient is to continue working towards exercising and journaling regularly to release negative emotions appropriately.  Patient is to start school in the spring. Long-term goal: Reduce the core conflict that is a source of anxiety Short-term goal: Describe current and past experiences with specific fears prominent worries and anxiety symptoms including their impact on functioning and attempts to resolve it   [name], [facility]

## 2020-01-04 ENCOUNTER — Telehealth: Payer: Self-pay

## 2020-01-04 NOTE — Telephone Encounter (Signed)
Prior Authorization submitted through cover my meds for AMPHETAMINE-DEXTROAMPHETAMINE ER 30 MG, approved effective 01/03/2020-01/02/2021 with [employer], PA# [id number]

## 2020-01-25 ENCOUNTER — Ambulatory Visit: Payer: 59 | Admitting: Psychiatry

## 2020-02-08 ENCOUNTER — Telehealth: Payer: Self-pay | Admitting: Psychiatry

## 2020-02-08 ENCOUNTER — Telehealth (INDEPENDENT_AMBULATORY_CARE_PROVIDER_SITE_OTHER): Payer: 59 | Admitting: Psychiatry

## 2020-02-08 ENCOUNTER — Encounter: Payer: Self-pay | Admitting: Psychiatry

## 2020-02-08 DIAGNOSIS — F410 Panic disorder [episodic paroxysmal anxiety] without agoraphobia: Secondary | ICD-10-CM

## 2020-02-08 DIAGNOSIS — F411 Generalized anxiety disorder: Secondary | ICD-10-CM

## 2020-02-08 DIAGNOSIS — F5105 Insomnia due to other mental disorder: Secondary | ICD-10-CM

## 2020-02-08 NOTE — Progress Notes (Signed)
Bethany Terry [id number] Feb 14, 2000 20 y.o.  Virtual Visit via Telephone Note  I connected with pt on 02/08/20 at 10:00 AM EST by telephone and verified that I am speaking with the correct person using two identifiers.   I discussed the limitations, risks, security and privacy concerns of performing an evaluation and management service by telephone and the availability of in person appointments. I also discussed with the patient that there may be a patient responsible charge related to this service. The patient expressed understanding and agreed to proceed.   I discussed the assessment and treatment plan with the patient. The patient was provided an opportunity to ask questions and all were answered. The patient agreed with the plan and demonstrated an understanding of the instructions.   The patient was advised to call back or seek an in-person evaluation if the symptoms worsen or if the condition fails to improve as anticipated.  I provided 30 minutes of non-face-to-face time during this encounter.  The patient was located at home.  The provider was located at [facility].   Corie Chiquito, PMHNP   Subjective:   Patient ID:  Bethany Terry is a 20 y.o. (DOB 04/09/2000) female.  Chief Complaint:  Chief Complaint  Patient presents with  . Anxiety  . Depression  . Insomnia    HPI Bethany Terry presents for follow-up of anxiety, depression, and insomnia. hShe reports that she is at the hospital with a family friend for about 10 hours a day. 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 Reports that she had increased depression and anxiety last month in response to some family stressors- "I feel like I am pulling myself out of it a little bit." She reports that anxiety is worsening and is feeling overwhelmed. She reports that her pharmacy told her she did not have  any refills remaining on Xanax. She reports that she has not had Xanax since late December. She reports that she is having difficulty with sleep and this can lead to panic s/s. She notices some increased HR at times. Difficulty staying asleep and reports awakening 2-3 times a night. Typically not having nightmares. Appetite fluctuates and reports that this may be related to change in in birth control. Reports feeling "very exhausted and always busy." She reports that she has been forgetting things that just happened. Difficulty with focus- "because I have so much on my mind." She reports that she has not been prioritizing her self-care. She reports procrastination. Denies SI.   Reports that she signed up for classes this semester and had some financial issues and put school on hold.   She reports that when Adderall wears off she feels "horrible" (nausea, hot flashes, headaches).   Switched to [employer] in late December.   She reports that she has not been taking Adderall the last few weeks.   Review of Systems:  Review of Systems  Musculoskeletal: Negative for gait problem.  Skin:       Acne  Neurological: Positive for headaches. Negative for tremors.  Psychiatric/Behavioral:       Please refer to HPI    Medications: I have reviewed the patient's current medications.  Current Outpatient Medications  Medication Sig Dispense Refill  . amphetamine-dextroamphetamine (ADDERALL XR) 30 MG 24 hr capsule Take 1 capsule (30 mg total) by mouth daily after breakfast. 30 capsule 0  .  etonogestrel (NEXPLANON) 68 MG IMPL implant Nexplanon 68 mg subdermal implant  Inject 1 implant by subcutaneous route.    . Fluticasone-Salmeterol (ADVAIR) 250-50 MCG/DOSE AEPB Inhale into the lungs.    Marland Kitchen albuterol (VENTOLIN HFA) 108 (90 Base) MCG/ACT inhaler Inhale 2 puffs into the lungs every 6 (six) hours as needed for wheezing. 1 Inhaler 0  . alprazolam (XANAX) 2 MG tablet Take 1 tablet at bedtime and 1/2-1 tablet as  needed for anxiety (Patient not taking: Reported on 02/08/2020) 45 tablet 3  . amphetamine-dextroamphetamine (ADDERALL XR) 30 MG 24 hr capsule Take 1 capsule (30 mg total) by mouth daily after breakfast. 30 capsule 0  . amphetamine-dextroamphetamine (ADDERALL XR) 30 MG 24 hr capsule Take 1 capsule (30 mg total) by mouth daily after breakfast. 30 capsule 0  . propranolol (INDERAL) 20 MG tablet TAKE 1 TABLET (20 MG TOTAL) BY MOUTH 3 (THREE) TIMES DAILY AS NEEDED (PANIC OR ANXIETY). (Patient not taking: No sig reported) 270 tablet 0   No current facility-administered medications for this visit.    Medication Side Effects: Other: Withdrawal s/s when Adderall wears off  Allergies:  Allergies  Allergen Reactions  . Naproxen Rash    Past Medical History:  Diagnosis Date  . Anxiety   . Arthritis   . Depression   . Insomnia     Family History  Adopted: Yes    Social History   Socioeconomic History  . Marital status: Significant Other    Spouse name: Not on file  . Number of children: Not on file  . Years of education: Not on file  . Highest education level: Not on file  Occupational History  . Not on file  Tobacco Use  . Smoking status: Never Smoker  . Smokeless tobacco: Never Used  Vaping Use  . Vaping Use: Never used  Substance and Sexual Activity  . Alcohol use: No  . Drug use: No  . Sexual activity: Yes    Birth control/protection: None  Other Topics Concern  . Not on file  Social History Narrative   02/25/2012 [facility] Bethany Terry was born in [place], and adopted at 11 months by her current family. She lives with her maternal grandparents, father, mother, and 24 year old brother. She began this year in the eighth grade at the [employer], but plans to transfer to [employer]. She enjoys hanging out with her friends, playing basketball and volleyball for her church leagues. 02/25/2012 [facility]   Current school [employer] in the 8th grade.    Social Determinants of Health   Financial Resource Strain: Not on file  Food Insecurity: Not on file  Transportation Needs: Not on file  Physical Activity: Not on file  Stress: Not on file  Social Connections: Not on file  Intimate Partner Violence: Not on file    Past Medical History, Surgical history, Social history, and Family history were reviewed and updated as appropriate.   Please see review of systems for further details on the patient's review from today.   Objective:   Physical Exam:  There were no vitals taken for this visit.  Physical Exam Constitutional:      General: She is not in acute distress. Musculoskeletal:        General: No deformity.  Neurological:     Mental Status: She is alert and oriented to person, place, and time.     Coordination: Coordination normal.  Psychiatric:        Attention and Perception:  Attention and perception normal. She does not perceive auditory or visual hallucinations.        Mood and Affect: Mood is anxious. Mood is not depressed. Affect is not labile, blunt, angry or inappropriate.        Speech: Speech normal.        Behavior: Behavior normal.        Thought Content: Thought content normal. Thought content is not paranoid or delusional. Thought content does not include homicidal or suicidal ideation. Thought content does not include homicidal or suicidal plan.        Cognition and Memory: Cognition and memory normal.        Judgment: Judgment normal.     Comments: Insight intact     Lab Review:     Component Value Date/Time   NA 140 09/08/2019 0006   K 3.6 09/08/2019 0006   CL 103 09/08/2019 0006   CO2 23 09/08/2019 0006   GLUCOSE 121 (H) 09/08/2019 0006   BUN 14 09/08/2019 0006   CREATININE 0.86 09/08/2019 0006   CALCIUM 9.5 09/08/2019 0006   PROT 7.7 10/10/2017 1811   ALBUMIN 4.2 10/10/2017 1811   AST 23 10/10/2017 1811   ALT 18 10/10/2017 1811   ALKPHOS 57 10/10/2017 1811   BILITOT 0.7 10/10/2017 1811    GFRNONAA >60 09/08/2019 0006   GFRAA >60 09/08/2019 0006       Component Value Date/Time   WBC 9.7 09/08/2019 0006   RBC 5.11 09/08/2019 0006   HGB 14.3 09/08/2019 0006   HCT 41.5 09/08/2019 0006   PLT 270 09/08/2019 0006   MCV 81.2 09/08/2019 0006   MCH 28.0 09/08/2019 0006   MCHC 34.5 09/08/2019 0006   RDW 11.7 09/08/2019 0006    No results found for: POCLITH, LITHIUM   No results found for: PHENYTOIN, PHENOBARB, VALPROATE, CBMZ   .res Assessment: Plan:   Discussed re-starting Xanax and Adderall. Clinic nurse contacted pt's pharmacy and they report that they have both scripts and will fill scripts.  Continue Xanax 2 mg po QHS and 1/2-1 tab po qd prn anxiety.  Continue Adderall XR 30 mg po qd for ADHD. Pt to follow-up in 2-3 months or sooner if clinically indicated.  Patient advised to contact office with any questions, adverse effects, or acute worsening in signs and symptoms.  Bethany Terry was seen today for anxiety, depression and insomnia.  Diagnoses and all orders for this visit:  Generalized anxiety disorder  Panic disorder  Insomnia disorder, with non-sleep disorder mental comorbidity, episodic    Please see After Visit Summary for patient specific instructions.  Future Appointments  Date Time Provider Department Center  02/09/2020  4:00 PM [name], [facility] CP-CP None    No orders of the defined types were placed in this encounter.     -------------------------------

## 2020-02-08 NOTE — Telephone Encounter (Signed)
[name], meline are scheduled for a virtual visit with your provider today.    Just as we do with appointments in the office, we must obtain your consent to participate.  Your consent will be active for this visit and any virtual visit you may have with one of our providers in the next 365 days.    If you have a MyChart account, I can also send a copy of this consent to you electronically.  All virtual visits are billed to your insurance company just like a traditional visit in the office.  As this is a virtual visit, video technology does not allow for your provider to perform a traditional examination.  This may limit your provider's ability to fully assess your condition.  If your provider identifies any concerns that need to be evaluated in person or the need to arrange testing such as labs, EKG, etc, we will make arrangements to do so.    Although advances in technology are sophisticated, we cannot ensure that it will always work on either your end or our end.  If the connection with a video visit is poor, we may have to switch to a telephone visit.  With either a video or telephone visit, we are not always able to ensure that we have a secure connection.   I need to obtain your verbal consent now.   Are you willing to proceed with your visit today?   Bethany Terry has provided verbal consent on 02/08/2020 for a virtual visit (video or telephone).   Corie Chiquito, PMHNP 02/08/2020  10:07 AM

## 2020-02-09 ENCOUNTER — Other Ambulatory Visit: Payer: Self-pay

## 2020-02-09 ENCOUNTER — Ambulatory Visit (INDEPENDENT_AMBULATORY_CARE_PROVIDER_SITE_OTHER): Payer: 59 | Admitting: Psychiatry

## 2020-02-09 DIAGNOSIS — F411 Generalized anxiety disorder: Secondary | ICD-10-CM | POA: Diagnosis not present

## 2020-02-09 NOTE — Progress Notes (Signed)
[facility] Counselor/Therapist Progress Note  Patient ID: Bethany Terry, MRN: [id number],    Date: 02/09/2020  Time Spent: 45 minutes start time 4:05 PM end time 4:50 pm Virtual Visit via Telephone Note Connected with patient by a video enabled telemedicine/telehealth application or telephone, with their informed consent, and verified patient privacy and that I am speaking with the correct person using two identifiers. I discussed the limitations, risks, security and privacy concerns of performing psychotherapy and management service by telephone and the availability of in person appointments. I also discussed with the patient that there may be a patient responsible charge related to this service. The patient expressed understanding and agreed to proceed. I discussed the treatment planning with the patient. The patient was provided an opportunity to ask questions and all were answered. The patient agreed with the plan and demonstrated an understanding of the instructions. The patient was advised to call  our office if  symptoms worsen or feel they are in a crisis state and need immediate contact.   Therapist Location: office Patient Location: hospital    Treatment Type: Individual Therapy  Reported Symptoms: anxiety, sadness, crying spells, panic, sleep issues, appetite change  Mental Status Exam:  Appearance:   NA     Behavior:  na  Motor:  na  Speech/Language:   Normal Rate  Affect:  NA  Mood:  anxious and sad  Thought process:  normal  Thought content:    WNL  Sensory/Perceptual disturbances:    WNL  Orientation:  oriented to person, place, time/date and situation  Attention:  Good  Concentration:  Good  Memory:  WNL  Fund of knowledge:   Good  Insight:    Good  Judgment:   Good  Impulse Control:  Good   Risk Assessment: Danger to Self:  No Self-injurious Behavior: No Danger to Others: No Duty to Warn:no Physical Aggression / Violence:No  Access to Firearms a  concern: No  Gang Involvement:No   Subjective: Met with patient via phone. She shared that she was in the hospital at the call due having to be there with a client.  She shared that her client has liver failure and a year to live so she is very sad over the situation.  She was able to reconcile with her boyfriend but he quit his job and has decided to join the [employer]. She shared her anxiety is very high due to all the turmoil.  She also ended a relationship with a close friend because she is back with her boyfriend and she is not checking on her. Patient was able to recognize that she could be in her head and not rational.  Patient was encouraged to not make any decisions when she is feeling so overwhelmed and stressed.  Patient shared she has been working 10-hour to 12 days 7 days a week for a few weeks and is completely exhausted.  Patient shared the grief of losing her patient is very overwhelming but she knows she wants to help her as long as she can.  She was encouraged to try and focus on her self-care and to find joy and simple things even if she only gets a few minutes at a time.  Stated that as soon as her stent of these long days is over she plans on taking a week just to debrief and get back to her exercising.  Patient reported she was not eating for a while but she has started trying to eat  again and that has been helpful.  She is still trying to figure out what she wants to do with her relationship and what she wants to do with her career.  Discussed different options and encouraged patient again to take things 1 step at a time and to remind herself not to make a rash decision when she is not at a good place.  Patient agreed to work on writing and exercise once she has some time again to help think through what her next path will be.  Interventions: Solution-Oriented/Positive Psychology  Diagnosis:   ICD-10-CM   1. Generalized anxiety disorder  F41.1     Plan: Patient is to use CBT and  coping skills to decrease anxiety symptoms.  Patient is to get back to writing and exercise to release emotions appropriately.  Patient is to work on not making any major decisions at this time until she is able to rest more and clear her head. Long-term goal: Resolve the core conflict that is the source of anxiety Short-term goal: Describe current and past experiences with specific fears prominent worries and anxiety symptoms including their impact on functioning and attempts to resolve it  [name], [facility]

## 2020-02-22 ENCOUNTER — Ambulatory Visit (INDEPENDENT_AMBULATORY_CARE_PROVIDER_SITE_OTHER): Payer: 59 | Admitting: Psychiatry

## 2020-02-22 ENCOUNTER — Other Ambulatory Visit: Payer: Self-pay

## 2020-02-22 DIAGNOSIS — F411 Generalized anxiety disorder: Secondary | ICD-10-CM

## 2020-02-22 NOTE — Progress Notes (Signed)
      [facility] Counselor/Therapist Progress Note  Patient ID: Bethany Terry, MRN: [id number],    Date: 02/22/2020  Time Spent: 51 minutes start time 3:07 PM end time 3:58 PM  Treatment Type: Individual Therapy  Reported Symptoms: anxiety, sadness, low motivation, fatigue  Mental Status Exam:  Appearance:   Casual and Neat     Behavior:  Appropriate  Motor:  Normal  Speech/Language:   Normal Rate  Affect:  Appropriate  Mood:  normal  Thought process:  normal  Thought content:    WNL  Sensory/Perceptual disturbances:    WNL  Orientation:  oriented to person, place, time/date and situation  Attention:  Good  Concentration:  Good  Memory:  WNL  Fund of knowledge:   Good  Insight:    Good  Judgment:   Good  Impulse Control:  Good   Risk Assessment: Danger to Self:  No Self-injurious Behavior: No Danger to Others: No Duty to Warn:no Physical Aggression / Violence:No  Access to Firearms a concern: No  Gang Involvement:No   Subjective: Patient was present for session. She shared that she just got over COVID. She reported that she just found out she owes $2,500.00 due to a hospital bill. She shared she had a break through with her dad.  She shared she went out to lunch with her dad and she told him all that she was holding in from childhood and that was very helpful. He has been doing better with telling her that he loves her.  Patient went on to share she is not sure what to do concerning her relationship with her mother.  She reported that she does not feel things will ever change and that she will ever be able to trust her mother.  Patient shared that she did confront her father on the fact that her mother has an addiction issue and that has been an issue since she was a child.  Patient also shared she and her boyfriend are doing better and he is still considering joining the [employer] which she is starting to come to terms with even though it still very concerning for her.   Patient was encouraged to take things 1 step at a time and to feel good about the progress she is making with her father and to recognize how huge that was for her to be able to share how she really feels with him.  The importance of releasing negative emotions from her body physically was discussed with patient.  She was also encouraged to continue working on talking through her childhood trauma.  Interventions: Cognitive Behavioral Therapy and Solution-Oriented/Positive Psychology  Diagnosis:   ICD-10-CM   1. Generalized anxiety disorder  F41.1     Plan: Patient is to use CBT and coping skills to decrease anxiety symptoms.  Patient is to continue working on releasing negative emotions through exercise.  Patient is to continue working on her relationship with her father. Long term goal: Resolve the core conflict that is a source of anxiety Short-term goal: Describe current and past experiences with specific fears prominent worries and anxiety symptoms including their impact on functioning and attempts to resolve it  [name], [facility]

## 2020-04-04 ENCOUNTER — Ambulatory Visit: Payer: 59 | Admitting: Psychiatry

## 2020-04-18 ENCOUNTER — Ambulatory Visit (INDEPENDENT_AMBULATORY_CARE_PROVIDER_SITE_OTHER): Payer: 59 | Admitting: Psychiatry

## 2020-04-18 ENCOUNTER — Other Ambulatory Visit: Payer: Self-pay

## 2020-04-18 DIAGNOSIS — F411 Generalized anxiety disorder: Secondary | ICD-10-CM | POA: Diagnosis not present

## 2020-04-18 NOTE — Progress Notes (Signed)
[facility] Counselor/Therapist Progress Note  Patient ID: Bethany Terry, MRN: [id number],    Date: 04/18/2020  Time Spent: 52 minutes start time 9:04 AM end time 9:56 AM  Treatment Type: Individual Therapy  Reported Symptoms: fatigue, anxiety, panic, sadness, frustration  Mental Status Exam:  Appearance:   Well Groomed     Behavior:  Appropriate  Motor:  Normal  Speech/Language:   Normal Rate  Affect:  Appropriate  Mood:  anxious  Thought process:  normal  Thought content:    WNL  Sensory/Perceptual disturbances:    WNL  Orientation:  oriented to person, place, time/date and situation  Attention:  Good  Concentration:  Good  Memory:  WNL  Fund of knowledge:   Good  Insight:    Good  Judgment:   Good  Impulse Control:  Good   Risk Assessment: Danger to Self:  No Self-injurious Behavior: No Danger to Others: No Duty to Warn:no Physical Aggression / Violence:No  Access to Firearms a concern: No  Gang Involvement:No   Subjective: Patient was present for session.  Patient shared that she has a new job which is hard and she is doing well.  She went on to share that she lost her home and had to move back in with her mother.  She stated that it is stressful with work  And living with her mother.  She shared that it is all still up in the air and it is hard for her to not have stability any longer. She shared she has been reflecting on what she wants to do in the future.  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  Patient was encouraged to start looking into what degree she would need to get into that position.  She is realizing all the pressure of figuring out her future while she is on her own and it has been overwhelming.  She stated she is planning on registering for classes in the fall. Updated treatment plan but could not sign due to COVID.  Patient was given time to process her current living situation.  She shared she is still struggling with her mother  because of her drinking and not being honest about it.  She also is not taking care of the house like she has in the past which is creating stress for patient and leaving her to have to do a lot for her.  Patient was encouraged to focus on the things that she can control fix and change.  She was reminded to use her box a.m. if she cannot control fix or change something she needs to throw it out and recognize that her mother is going to have her issues and she cannot fix him for her.  The importance of exercising and focusing on her self-care was addressed with patient especially as she is residing with her mother.  Interventions: Cognitive Behavioral Therapy and Solution-Oriented/Positive Psychology  Diagnosis:   ICD-10-CM   1. Generalized anxiety disorder  F41.1     Plan: Patient is to use CBT and coping skills to decrease anxiety symptoms.  Patient is to focus on the things that she can control fix and change and to practice the box theory.  Patient is to exercise to release negative emotions appropriately.  Patient is to see her provider Bethany Terry, PMH, NP as soon as possible to discuss medication. Long-term goal: Resolve the core conflict that is the source of anxiety Short-term goal: Describe current  and past experiences with specific fears prominent worries and anxiety symptoms including their impact on functioning and attempts to resolve it  Bethany Terry, [facility]

## 2020-05-02 ENCOUNTER — Ambulatory Visit (INDEPENDENT_AMBULATORY_CARE_PROVIDER_SITE_OTHER): Payer: 59 | Admitting: Psychiatry

## 2020-05-02 ENCOUNTER — Other Ambulatory Visit: Payer: Self-pay

## 2020-05-02 DIAGNOSIS — F411 Generalized anxiety disorder: Secondary | ICD-10-CM | POA: Diagnosis not present

## 2020-05-02 NOTE — Progress Notes (Signed)
      [facility] Counselor/Therapist Progress Note  Patient ID: Bethany Terry, MRN: [id number],    Date: 05/02/2020  Time Spent: 50 minutes start time 10:04 AM end time 10:54 AM  Treatment Type: Individual Therapy  Reported Symptoms: anxiety, sleep issues, fatigue, obsessive thinking, compulsive behaviors, frustrations  Mental Status Exam:  Appearance:   Casual     Behavior:  Appropriate  Motor:  Normal  Speech/Language:   Normal Rate  Affect:  Appropriate  Mood:  normal  Thought process:  normal  Thought content:    WNL  Sensory/Perceptual disturbances:    WNL  Orientation:  oriented to person, place, time/date and situation  Attention:  Good  Concentration:  Good  Memory:  WNL  Fund of knowledge:   Good  Insight:    Good  Judgment:   Good  Impulse Control:  Good   Risk Assessment: Danger to Self:  No Self-injurious Behavior: No Danger to Others: No Duty to Warn:no Physical Aggression / Violence:No  Access to Firearms a concern: No  Gang Involvement:No   Subjective: Patient was present for session.  She reported she is having lots of anxiety and frustration with having to live with her mother.  She stated she feels she is constantly having to say the same thing over and over again. Mother is blaming patient and her boyfriend for things that aren't their issues.  She is realizing how dependent her mother is on others which is very frustrating. Her car is breaking down and she has to get a new one for work. She stated work is unpredictable and that is creating anxiety.  Patient did EMDR set on her mother's mess and blaming others, suds level 8, negative cognition "I have a child" felt anger annoyance confusion frustration her chest.  Patient was able to reduce suds level to 5.  Patient was given time to try and figure out ways to communicate with her mother without being disrespectful.  Patient went on to share she is realizing she needs to talk through several of the traumas  in her life that she has not dealt with.  Agreed to start working on that at next session.  Interventions: Solution-Oriented/Positive Psychology and Eye Movement Desensitization and Reprocessing (EMDR)  Diagnosis:   ICD-10-CM   1. Generalized anxiety disorder  F41.1     Plan: Patient is to use CBT and coping skills to decrease anxiety symptoms.  Patient is to follow plans from session to communicate her frustrations with her mother in an appropriate manner.  Patient is to continue exercising to release negative emotions appropriately.  Patient is to work on sharing details of traumas that she needs to release. Long-term goal: Resolve the core conflict that is the source of anxiety Short-term goal: Describe current and past experiences with specific fears prominent worries and anxiety symptoms including their impact on functioning and attempts to resolve it  [name], [facility]

## 2020-05-31 ENCOUNTER — Ambulatory Visit (INDEPENDENT_AMBULATORY_CARE_PROVIDER_SITE_OTHER): Payer: 59 | Admitting: Psychiatry

## 2020-05-31 ENCOUNTER — Encounter: Payer: Self-pay | Admitting: Psychiatry

## 2020-05-31 ENCOUNTER — Other Ambulatory Visit: Payer: Self-pay

## 2020-05-31 DIAGNOSIS — F902 Attention-deficit hyperactivity disorder, combined type: Secondary | ICD-10-CM

## 2020-05-31 DIAGNOSIS — F411 Generalized anxiety disorder: Secondary | ICD-10-CM | POA: Diagnosis not present

## 2020-05-31 DIAGNOSIS — F5105 Insomnia due to other mental disorder: Secondary | ICD-10-CM

## 2020-05-31 DIAGNOSIS — F410 Panic disorder [episodic paroxysmal anxiety] without agoraphobia: Secondary | ICD-10-CM | POA: Diagnosis not present

## 2020-05-31 MED ORDER — AMPHETAMINE-DEXTROAMPHET ER 30 MG PO CP24: 30.0000 mg | ORAL_CAPSULE | Freq: Every day | ORAL | 0 refills | Status: DC

## 2020-05-31 MED ORDER — ALPRAZOLAM 1 MG PO TABS: ORAL_TABLET | ORAL | 2 refills | Status: DC

## 2020-05-31 NOTE — Progress Notes (Signed)
Bethany Terry [id number] Jun 12, 2000 20 y.o.  Subjective:   Patient ID:  Bethany Terry is a 20 y.o. (DOB 2000-11-09) female.  Chief Complaint:  Chief Complaint  Patient presents with  . Anxiety  . Depression  . Insomnia    HPI Bethany Terry presents to the office today for follow-up of anxiety, depression, and insomnia. She moved in with her mother after being evicted from her house. "I feel like I am doing a lot better than I was." She reports that she has weaned off Xanax and has not taken any recently. She reports anxiety has been improved. Denies any recent panic attacks. Some worry and reports this is less overall. She reports that she has not been sleeping as well since she moved and has difficulty sleeping due to it being warm. She has been without Adderall for about a month. She reports difficulty with focus. She is now driving frequently with her current job. Has been procrastinating. She has been exercising daily and changed her diet a month ago. She reports that she is engaging in more self-care. She reports occasional "outbursts," such as crying for no reason or "very angry" without apparent trigger. She reports excessive spending in the past. Denies current impulsivity or risky behavior. She reports that if she has impulsive thoughts she will consciously try to control this. She reports that she was almost obsessive about exercise. She reports that her energy level seems to fluctuate significantly. She reports that yesterday she was sad and tired. Denies any decreased need for sleep and will miss sleep if she sleeps less. Denies SI.   Is taking monthly mini-vacations to help with perspective.   She noticed a tea was increasing her anxiety.   PHQ2-9   Flowsheet Row Office Visit from 02/17/2017 in Primary Care at [facility] Visit from 11/19/2016 in Primary Care at [facility] Visit from 10/08/2016 in Primary Care at [facility] Visit from 02/21/2015 in Primary Care at [facility]  Visit from 01/19/2015 in Primary Care at [facility] Total Score 0 0 0 0 0       Review of Systems:  Review of Systems  Cardiovascular: Positive for palpitations.       Reports palpitations on days she has and has not had Adderall XR  Gastrointestinal:       Has had some GI s/s which she attributes to intermittent fasting  Musculoskeletal: Negative for gait problem.  Neurological: Negative for tremors.  Psychiatric/Behavioral:       Please refer to HPI    Medications: I have reviewed the patient's current medications.  Current Outpatient Medications  Medication Sig Dispense Refill  . albuterol (VENTOLIN HFA) 108 (90 Base) MCG/ACT inhaler Inhale 2 puffs into the lungs every 6 (six) hours as needed for wheezing. 1 Inhaler 0  . etonogestrel (NEXPLANON) 68 MG IMPL implant Nexplanon 68 mg subdermal implant  Inject 1 implant by subcutaneous route.    Marland Kitchen alprazolam (XANAX) 1 MG tablet Take 1 tablet at bedtime and 1/2-1 tablet as needed for anxiety 30 tablet 2  . [START ON 07/26/2020] amphetamine-dextroamphetamine (ADDERALL XR) 30 MG 24 hr capsule Take 1 capsule (30 mg total) by mouth daily after breakfast. 30 capsule 0  . [START ON 06/28/2020] amphetamine-dextroamphetamine (ADDERALL XR) 30 MG 24 hr capsule Take 1 capsule (30 mg total) by mouth daily after breakfast. 30 capsule 0  . amphetamine-dextroamphetamine (ADDERALL XR) 30 MG 24 hr capsule Take 1 capsule (30 mg total) by mouth daily after breakfast. 30  capsule 0  . Fluticasone-Salmeterol (ADVAIR) 250-50 MCG/DOSE AEPB Inhale into the lungs. (Patient not taking: Reported on 05/31/2020)     No current facility-administered medications for this visit.    Medication Side Effects: None  Allergies:  Allergies  Allergen Reactions  . Naproxen Rash    Past Medical History:  Diagnosis Date  . Anxiety   . Arthritis   . Depression   . Insomnia     Past Medical History, Surgical history, Social history, and Family history were reviewed  and updated as appropriate.   Please see review of systems for further details on the patient's review from today.   Objective:   Physical Exam:  BP 129/81   Pulse 77   Physical Exam Constitutional:      General: She is not in acute distress. Musculoskeletal:        General: No deformity.  Neurological:     Mental Status: She is alert and oriented to person, place, and time.     Coordination: Coordination normal.  Psychiatric:        Attention and Perception: Attention and perception normal. She does not perceive auditory or visual hallucinations.        Mood and Affect: Mood normal. Mood is not anxious or depressed. Affect is not labile, blunt, angry or inappropriate.        Speech: Speech normal.        Behavior: Behavior normal.        Thought Content: Thought content normal. Thought content is not paranoid or delusional. Thought content does not include homicidal or suicidal ideation. Thought content does not include homicidal or suicidal plan.        Cognition and Memory: Cognition and memory normal.        Judgment: Judgment normal.     Comments: Insight intact     Lab Review:     Component Value Date/Time   NA 140 09/08/2019 0006   K 3.6 09/08/2019 0006   CL 103 09/08/2019 0006   CO2 23 09/08/2019 0006   GLUCOSE 121 (H) 09/08/2019 0006   BUN 14 09/08/2019 0006   CREATININE 0.86 09/08/2019 0006   CALCIUM 9.5 09/08/2019 0006   PROT 7.7 10/10/2017 1811   ALBUMIN 4.2 10/10/2017 1811   AST 23 10/10/2017 1811   ALT 18 10/10/2017 1811   ALKPHOS 57 10/10/2017 1811   BILITOT 0.7 10/10/2017 1811   GFRNONAA >60 09/08/2019 0006   GFRAA >60 09/08/2019 0006       Component Value Date/Time   WBC 9.7 09/08/2019 0006   RBC 5.11 09/08/2019 0006   HGB 14.3 09/08/2019 0006   HCT 41.5 09/08/2019 0006   PLT 270 09/08/2019 0006   MCV 81.2 09/08/2019 0006   MCH 28.0 09/08/2019 0006   MCHC 34.5 09/08/2019 0006   RDW 11.7 09/08/2019 0006    No results found for: POCLITH,  LITHIUM   No results found for: PHENYTOIN, PHENOBARB, VALPROATE, CBMZ   .res Assessment: Plan:   Patient seen for 30 minutes and time spent discussing treatment plan.  Patient reports that she would like to continue to alleviate anxiety with nonpharmacological strategies, to include improved self-care, therapy, and taking regular breaks. Patient reports that she was admitted like to take Xanax only on rare occasions as needed.  Will change Xanax from 2 mg tablets to 1 mg at bedtime and 1/2 to 1 tablet as needed for anxiety. Will continue Paxil 30 g daily for attention deficit disorder. Recommend continuing psychotherapy  with [name], [facility]. Patient to follow-up with this provider in 3 months or sooner if clinically indicated. Patient advised to contact office with any questions, adverse effects, or acute worsening in signs and symptoms.   Shanty was seen today for anxiety, depression and insomnia.  Diagnoses and all orders for this visit:  Attention deficit hyperactivity disorder (ADHD), combined type, moderate -     amphetamine-dextroamphetamine (ADDERALL XR) 30 MG 24 hr capsule; Take 1 capsule (30 mg total) by mouth daily after breakfast. -     amphetamine-dextroamphetamine (ADDERALL XR) 30 MG 24 hr capsule; Take 1 capsule (30 mg total) by mouth daily after breakfast. -     amphetamine-dextroamphetamine (ADDERALL XR) 30 MG 24 hr capsule; Take 1 capsule (30 mg total) by mouth daily after breakfast.  Generalized anxiety disorder -     alprazolam (XANAX) 1 MG tablet; Take 1 tablet at bedtime and 1/2-1 tablet as needed for anxiety  Panic disorder -     alprazolam (XANAX) 1 MG tablet; Take 1 tablet at bedtime and 1/2-1 tablet as needed for anxiety  Insomnia disorder, with non-sleep disorder mental comorbidity, episodic -     alprazolam (XANAX) 1 MG tablet; Take 1 tablet at bedtime and 1/2-1 tablet as needed for anxiety     Please see After Visit Summary for patient specific  instructions.  Future Appointments  Date Time Provider Department Center  06/08/2020  9:00 AM [name], [facility] CP-CP None  06/20/2020  9:00 AM [name], [facility] CP-CP None  07/04/2020  9:00 AM [name], [facility] CP-CP None  07/18/2020 10:00 AM [name], [facility] CP-CP None  08/31/2020  9:00 AM Corie Chiquito, PMHNP CP-CP None    No orders of the defined types were placed in this encounter.   -------------------------------

## 2020-06-08 ENCOUNTER — Other Ambulatory Visit: Payer: Self-pay

## 2020-06-08 ENCOUNTER — Ambulatory Visit (INDEPENDENT_AMBULATORY_CARE_PROVIDER_SITE_OTHER): Payer: 59 | Admitting: Psychiatry

## 2020-06-08 DIAGNOSIS — F411 Generalized anxiety disorder: Secondary | ICD-10-CM | POA: Diagnosis not present
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
## 2020-06-20 ENCOUNTER — Ambulatory Visit: Payer: 59 | Admitting: Psychiatry

## 2020-07-04 ENCOUNTER — Ambulatory Visit (INDEPENDENT_AMBULATORY_CARE_PROVIDER_SITE_OTHER): Payer: 59 | Admitting: Psychiatry

## 2020-07-04 DIAGNOSIS — F411 Generalized anxiety disorder: Secondary | ICD-10-CM | POA: Diagnosis not present
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
## 2020-07-18 ENCOUNTER — Ambulatory Visit: Payer: 59 | Admitting: Psychiatry

## 2020-08-02 ENCOUNTER — Other Ambulatory Visit: Payer: Self-pay

## 2020-08-02 ENCOUNTER — Ambulatory Visit (INDEPENDENT_AMBULATORY_CARE_PROVIDER_SITE_OTHER): Payer: 59 | Admitting: Psychiatry

## 2020-08-02 DIAGNOSIS — F411 Generalized anxiety disorder: Secondary | ICD-10-CM | POA: Diagnosis not present
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
## 2020-08-28 ENCOUNTER — Ambulatory Visit (INDEPENDENT_AMBULATORY_CARE_PROVIDER_SITE_OTHER): Payer: 59 | Admitting: Psychiatry

## 2020-08-28 ENCOUNTER — Other Ambulatory Visit: Payer: Self-pay

## 2020-08-28 DIAGNOSIS — F411 Generalized anxiety disorder: Secondary | ICD-10-CM
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
## 2020-08-31 ENCOUNTER — Ambulatory Visit: Payer: 59 | Admitting: Psychiatry

## 2020-10-02 ENCOUNTER — Encounter: Payer: Self-pay | Admitting: Psychiatry

## 2020-10-02 ENCOUNTER — Other Ambulatory Visit: Payer: Self-pay

## 2020-10-02 ENCOUNTER — Ambulatory Visit (INDEPENDENT_AMBULATORY_CARE_PROVIDER_SITE_OTHER): Payer: 59 | Admitting: Psychiatry

## 2020-10-02 DIAGNOSIS — F411 Generalized anxiety disorder: Secondary | ICD-10-CM

## 2020-10-02 NOTE — Progress Notes (Signed)
      [facility] Counselor/Therapist Progress Note  Patient ID: Bethany Terry, MRN: [id number],    Date: 10/02/2020  Time Spent: 50 minutes start time 2:08 PM end time 2:58 PM  Treatment Type: Individual Therapy  Reported Symptoms: anxiety, triggered responses, frustration, panic attack, sadness  Mental Status Exam:  Appearance:   Well Groomed     Behavior:  Appropriate  Motor:  Normal  Speech/Language:   Normal Rate  Affect:  Appropriate  Mood:  anxious and sad  Thought process:  normal  Thought content:    WNL  Sensory/Perceptual disturbances:    WNL  Orientation:  oriented to person, place, time/date, and situation  Attention:  Good  Concentration:  Good  Memory:  WNL  Fund of knowledge:   Good  Insight:    Good  Judgment:   Good  Impulse Control:  Good   Risk Assessment: Danger to Self:  No Self-injurious Behavior: No Danger to Others: No Duty to Warn:no Physical Aggression / Violence:No  Access to Firearms a concern: No  Gang Involvement:No   Subjective: Patient was present for session.  She shared that she was in [facility] for work but she is back currently.  She went on to share she moved out of her mother's home yesterday and so she and her boyfriend are staying with her father which is hard and stressful. She shared she has recently been diagnosed with diabetes.  Patient reported she was very upset over the whole situation with her mother.  Patient was allowed time to share the details of the situation and process how she feels about everything.  Patient was able to think through what would be the priorities with her moving in with her father and how she would need to deal with the situation so that she would be okay.  She was encouraged to try and recognize all the strengths in all that she is learned over the past several years and that she has the ability to do what she needs to do to take care of herself in any situation.  Patient was also encouraged to continue working  on finding peace with herself as well as her situation.  Interventions: Solution-Oriented/Positive Psychology and Insight-Oriented  Diagnosis:   ICD-10-CM   1. Generalized anxiety disorder  F41.1       Plan: Patient is to use CBT and coping skills to decrease anxiety symptoms.  Patient is to work on organizing her father's living situation so they can make it with blood sugar.  Patient is to exercise to release negative emotions appropriately.  Patient is to take medication as directed. Long-term goal: Resolve the core conflict that is the source of anxiety Short-term goal: Describe current and past experiences with specific fears prominent worries and anxiety symptoms including their impact on functioning and attempts to resolve it    [name], [facility]

## 2020-10-03 ENCOUNTER — Ambulatory Visit (INDEPENDENT_AMBULATORY_CARE_PROVIDER_SITE_OTHER): Payer: 59 | Admitting: Psychiatry

## 2020-10-03 ENCOUNTER — Encounter: Payer: Self-pay | Admitting: Psychiatry

## 2020-10-03 DIAGNOSIS — F902 Attention-deficit hyperactivity disorder, combined type: Secondary | ICD-10-CM | POA: Diagnosis not present

## 2020-10-03 DIAGNOSIS — F411 Generalized anxiety disorder: Secondary | ICD-10-CM | POA: Diagnosis not present

## 2020-10-03 DIAGNOSIS — F410 Panic disorder [episodic paroxysmal anxiety] without agoraphobia: Secondary | ICD-10-CM

## 2020-10-03 DIAGNOSIS — F5105 Insomnia due to other mental disorder: Secondary | ICD-10-CM | POA: Diagnosis not present

## 2020-10-03 MED ORDER — ALPRAZOLAM 1 MG PO TABS: ORAL_TABLET | ORAL | 2 refills | Status: DC

## 2020-10-03 MED ORDER — BUSPIRONE HCL 15 MG PO TABS: ORAL_TABLET | ORAL | 1 refills | Status: DC

## 2020-10-03 MED ORDER — AMPHETAMINE-DEXTROAMPHET ER 30 MG PO CP24: 30.0000 mg | ORAL_CAPSULE | Freq: Every day | ORAL | 0 refills | Status: DC

## 2020-10-03 NOTE — Progress Notes (Signed)
Bethany Terry [id number] Aug 09, 2000 20 y.o.  Subjective:   Patient ID:  Bethany Terry is a 20 y.o. (DOB 02-03-2000) female.  Chief Complaint:  Chief Complaint  Patient presents with   Follow-up    Anxiety, ADHD, and insomnia    HPI [name] presents to the office today for follow-up of anxiety, ADHD, and insomnia. Moved in with her mother and then moved out. Just moved in with her father to help him with hoarding. Has decided to stop contact with mother. Boyfriend is going into the [employer] and they are contemplating getting married. They have been together for 6.5 years. She reports some anxiety in response to relationship with her mom and had panic attacks before moving out.  Traveling frequently for work. She reports that she has been working on improving her self care. She reports that anxiety has improved some after moving out of mother's. She reports that she has had some difficulty with concentration. Reports that she has not had Adderall XR. She reports poor sleep and has not been able to get Xanax. Awakening every 2 hours and awake for an hour. She reports some depressed mood. She reports, "I have a lot of anger... extreme off the spectrum emotions." She reports, "I can't control what I am feeling but I can control what I act on now." Energy was good for awhile when fasting. She reports that energy is lower when she travels or changes her eating habits. Appetite has been "normal, I guess" with occ binge eating. She reports that at times she will follow strict diets. Reports that she has been stressed about weight gain. Exercising twice a day and has been unable to lose weight. Denies SI.   Adderall XR Vyvanse Ritalin Xanax Propranolol Cymbalta Sertraline Celexa Prozac Lexapro Wellbutrin XL- Somewhat helpful Strattera Lamictal Trazodone- Caused hallucinations Clonidine  PHQ2-9    Flowsheet Row Office Visit from 02/17/2017 in Primary Care at [facility] Visit from  11/19/2016 in Primary Care at [facility] Visit from 10/08/2016 in Primary Care at [facility] Visit from 02/21/2015 in Primary Care at [facility] Visit from 01/19/2015 in Primary Care at [facility] Total Score 0 0 0 0 0        Review of Systems:  Review of Systems  Gastrointestinal:  Positive for nausea.  Endocrine:       Reports that she was dx'd with Borderline DM and has episodes of hypoglycemia  Musculoskeletal:  Positive for back pain and neck pain. Negative for gait problem.  Neurological:        Occ severe headaches  Psychiatric/Behavioral:         Please refer to HPI   Medications: I have reviewed the patient's current medications.  Current Outpatient Medications  Medication Sig Dispense Refill   etonogestrel (NEXPLANON) 68 MG IMPL implant Nexplanon 68 mg subdermal implant  Inject 1 implant by subcutaneous route.     albuterol (VENTOLIN HFA) 108 (90 Base) MCG/ACT inhaler Inhale 2 puffs into the lungs every 6 (six) hours as needed for wheezing. 1 Inhaler 0   ALPRAZolam (XANAX) 1 MG tablet Take 1 tablet at bedtime and 1/2-1 tablet as needed for anxiety 30 tablet 2   amphetamine-dextroamphetamine (ADDERALL XR) 30 MG 24 hr capsule Take 1 capsule (30 mg total) by mouth daily after breakfast. 30 capsule 0   [START ON 10/31/2020] amphetamine-dextroamphetamine (ADDERALL XR) 30 MG 24 hr capsule Take 1 capsule (30 mg total) by mouth daily after breakfast. 30 capsule 0  amphetamine-dextroamphetamine (ADDERALL XR) 30 MG 24 hr capsule Take 1 capsule (30 mg total) by mouth daily after breakfast. 30 capsule 0   Fluticasone-Salmeterol (ADVAIR) 250-50 MCG/DOSE AEPB Inhale into the lungs. (Patient not taking: Reported on 05/31/2020)     No current facility-administered medications for this visit.    Medication Side Effects: Other: N/A  Allergies:  Allergies  Allergen Reactions   Naproxen Rash    Past Medical History:  Diagnosis Date   Anxiety    Arthritis    Depression     Insomnia     Past Medical History, Surgical history, Social history, and Family history were reviewed and updated as appropriate.   Please see review of systems for further details on the patient's review from today.   Objective:   Physical Exam:  BP 111/71   Pulse 83   Physical Exam Constitutional:      General: She is not in acute distress. Musculoskeletal:        General: No deformity.  Neurological:     Mental Status: She is alert and oriented to person, place, and time.     Coordination: Coordination normal.  Psychiatric:        Attention and Perception: Attention and perception normal. She does not perceive auditory or visual hallucinations.        Mood and Affect: Mood is anxious. Mood is not depressed. Affect is not labile, blunt, angry or inappropriate.        Speech: Speech normal.        Behavior: Behavior normal.        Thought Content: Thought content normal. Thought content is not paranoid or delusional. Thought content does not include homicidal or suicidal ideation. Thought content does not include homicidal or suicidal plan.        Cognition and Memory: Cognition and memory normal.        Judgment: Judgment normal.     Comments: Insight intact    Lab Review:     Component Value Date/Time   NA 140 09/08/2019 0006   K 3.6 09/08/2019 0006   CL 103 09/08/2019 0006   CO2 23 09/08/2019 0006   GLUCOSE 121 (H) 09/08/2019 0006   BUN 14 09/08/2019 0006   CREATININE 0.86 09/08/2019 0006   CALCIUM 9.5 09/08/2019 0006   PROT 7.7 10/10/2017 1811   ALBUMIN 4.2 10/10/2017 1811   AST 23 10/10/2017 1811   ALT 18 10/10/2017 1811   ALKPHOS 57 10/10/2017 1811   BILITOT 0.7 10/10/2017 1811   GFRNONAA >60 09/08/2019 0006   GFRAA >60 09/08/2019 0006       Component Value Date/Time   WBC 9.7 09/08/2019 0006   RBC 5.11 09/08/2019 0006   HGB 14.3 09/08/2019 0006   HCT 41.5 09/08/2019 0006   PLT 270 09/08/2019 0006   MCV 81.2 09/08/2019 0006   MCH 28.0 09/08/2019  0006   MCHC 34.5 09/08/2019 0006   RDW 11.7 09/08/2019 0006    No results found for: POCLITH, LITHIUM   No results found for: PHENYTOIN, PHENOBARB, VALPROATE, CBMZ   .res Assessment: Plan:     Pt seen for 30 minutes and time spent discussing treatment plan. She reports that she has had difficulty with pharmacy receiving/filling medication and discussed providing her with a hard script to take to pharmacy to avoid lapse in treatment.  Will re-start Alprazolam 1 mg po QHS and 1/2-1 tablet as needed for anxiety.  Will re-start Adderall XR 30 mg po q am for  ADHD.  Discussed potential benefits, risks, and side effects of BuSpar.  Patient agrees to trial of BuSpar.  Will start BuSpar 15 mg 1/3 tablet twice daily for 1 week, then increase to 2/3 tablet twice daily for 1 week, then increase to 1 tablet twice daily for anxiety. Recommend continuing therapy with [name], [facility]. Patient advised to contact office with any questions, adverse effects, or acute worsening in signs and symptoms.   Junelle was seen today for follow-up.  Diagnoses and all orders for this visit:  Generalized anxiety disorder -     ALPRAZolam (XANAX) 1 MG tablet; Take 1 tablet at bedtime and 1/2-1 tablet as needed for anxiety  Panic disorder -     ALPRAZolam (XANAX) 1 MG tablet; Take 1 tablet at bedtime and 1/2-1 tablet as needed for anxiety  Insomnia disorder, with non-sleep disorder mental comorbidity, episodic -     ALPRAZolam (XANAX) 1 MG tablet; Take 1 tablet at bedtime and 1/2-1 tablet as needed for anxiety  Attention deficit hyperactivity disorder (ADHD), combined type, moderate -     amphetamine-dextroamphetamine (ADDERALL XR) 30 MG 24 hr capsule; Take 1 capsule (30 mg total) by mouth daily after breakfast. -     amphetamine-dextroamphetamine (ADDERALL XR) 30 MG 24 hr capsule; Take 1 capsule (30 mg total) by mouth daily after breakfast.    Please see After Visit Summary for patient specific  instructions.  Future Appointments  Date Time Provider Department Center  10/18/2020 10:00 AM [name], [facility] [place] CP-CP None  11/14/2020  9:30 AM Corie Chiquito, [facility]NP CP-CP None    No orders of the defined types were placed in this encounter.   -------------------------------

## 2020-10-18 ENCOUNTER — Ambulatory Visit: Payer: 59 | Admitting: Psychiatry

## 2020-10-23 ENCOUNTER — Ambulatory Visit (INDEPENDENT_AMBULATORY_CARE_PROVIDER_SITE_OTHER): Payer: 59 | Admitting: Psychiatry

## 2020-10-23 ENCOUNTER — Other Ambulatory Visit: Payer: Self-pay

## 2020-10-23 ENCOUNTER — Telehealth: Payer: Self-pay | Admitting: Psychiatry

## 2020-10-23 DIAGNOSIS — F410 Panic disorder [episodic paroxysmal anxiety] without agoraphobia: Secondary | ICD-10-CM

## 2020-10-23 DIAGNOSIS — F411 Generalized anxiety disorder: Secondary | ICD-10-CM | POA: Diagnosis not present

## 2020-10-23 DIAGNOSIS — F5105 Insomnia due to other mental disorder: Secondary | ICD-10-CM

## 2020-10-23 MED ORDER — ALPRAZOLAM 2 MG PO TABS: 2.0000 mg | ORAL_TABLET | Freq: Every day | ORAL | 1 refills | Status: DC | PRN

## 2020-10-23 NOTE — Telephone Encounter (Signed)
Called pt back. She reports that she started having a rash on her neck and throat irritation after starting Buspar. Advised not to take Buspar again. Discussed that Buspar would be added to her allergy list.   Had severe pain in her back while going through airport security and collapsed. She was then taken to the hospital by EMS. She reports that she was not told of a clear cause for these s/s. She reports noticing muscle tension.   She reports that she has had to take Xanax 2 mg to "have any effect" and is taking this daily.   Patient advised to contact office with any questions, adverse effects, or acute worsening in signs and symptoms.

## 2020-10-23 NOTE — Progress Notes (Signed)
      [facility] Counselor/Therapist Progress Note  Patient ID: Bethany Terry, MRN: [id number],    Date: 10/23/2020  Time Spent: 50 minutes 9:07 AM end time 9:57 AM  Treatment Type: Individual Therapy  Reported Symptoms: anxiety, sadness, pain, triggered responses, sleep issues, rumination,crying spells  Mental Status Exam:  Appearance:   Casual     Behavior:  Appropriate  Motor:  Motor skills appeared normal but patient was noticeably in pain when she sat down and when she got up  Speech/Language:   Normal Rate  Affect:  Appropriate and Tearful  Mood:  anxious and sad  Thought process:  normal  Thought content:    WNL  Sensory/Perceptual disturbances:    pain  Orientation:  oriented to person, place, time/date, and situation  Attention:  Good  Concentration:  Good  Memory:  WNL  Fund of knowledge:   Good  Insight:    Good  Judgment:   Good  Impulse Control:  Good   Risk Assessment: Danger to Self:  No Self-injurious Behavior: No Danger to Others: No Duty to Warn:no Physical Aggression / Violence:No  Access to Firearms a concern: No  Gang Involvement:No   Subjective: Patient was present for session. She shared she had just gotten back from [place] where she was in the hospital after collapsing in the airport. She shared she had to have an IV and it was very traumatic.  Patient discussed all that she had been through over the past week.  She explained that she felt very frustrated and overwhelmed because she is still in a great deal of pain and nobody really told her at the hospital what was going on.  She was able to look in her MyChart and it stated a variety of different things were going on with her body but nothing that made sense for the pain she is still physically in.  Patient was encouraged to go back to PCP or in urgent care now that she is back in [place] and discussed the situation and be able to feel relief from the pain.  Patient stated she was given  morphine but she does not like to take pain medication and would rather know what is currently going on with her.  Patient also discussed her current relationship and different things that are concerning.  Encouraged patient to communicate needs with her boyfriend and to try and focus on the positives.  Patient was encouraged to focus on the things that she can control fix and change and make sure she is focusing on her own self-care.  Interventions: Solution-Oriented/Positive Psychology  Diagnosis:   ICD-10-CM   1. Generalized anxiety disorder  F41.1       Plan: Patient is to use CBT and coping skills to decrease anxiety symptoms.  Patient is to work on her self-care including getting to her PCP or going to an urgent care for more testing since she is reporting pain.  Patient is to take medication as directed Long-term goal: Resolve the core conflict that is the source of anxiety Short-term goal: Describe current and past experiences with specific fears prominent worries and anxiety symptoms including their impact on functioning and attempts to resolve it  [name], [facility]

## 2020-10-23 NOTE — Telephone Encounter (Signed)
Attempted to reach pt after she reported to therapist that she stopped Buspar after developing a rash. No answer.

## 2020-10-29 ENCOUNTER — Other Ambulatory Visit: Payer: Self-pay | Admitting: Nurse Practitioner

## 2020-10-29 DIAGNOSIS — N939 Abnormal uterine and vaginal bleeding, unspecified: Secondary | ICD-10-CM

## 2020-10-30 ENCOUNTER — Other Ambulatory Visit: Payer: Self-pay | Admitting: Nurse Practitioner

## 2020-10-30 ENCOUNTER — Ambulatory Visit
Admission: RE | Admit: 2020-10-30 | Discharge: 2020-10-30 | Disposition: A | Discharge status: Home / Self Care | Payer: Managed Care, Other (non HMO) | Source: Ambulatory Visit | Attending: Nurse Practitioner | Admitting: Nurse Practitioner

## 2020-10-30 DIAGNOSIS — N939 Abnormal uterine and vaginal bleeding, unspecified: Secondary | ICD-10-CM

## 2020-10-30 DIAGNOSIS — M545 Low back pain, unspecified: Secondary | ICD-10-CM

## 2020-11-14 ENCOUNTER — Encounter: Payer: Self-pay | Admitting: Psychiatry

## 2020-11-14 ENCOUNTER — Other Ambulatory Visit: Payer: Self-pay

## 2020-11-14 ENCOUNTER — Ambulatory Visit (INDEPENDENT_AMBULATORY_CARE_PROVIDER_SITE_OTHER): Payer: 59 | Admitting: Psychiatry

## 2020-11-14 DIAGNOSIS — F341 Dysthymic disorder: Secondary | ICD-10-CM | POA: Diagnosis not present

## 2020-11-14 DIAGNOSIS — F411 Generalized anxiety disorder: Secondary | ICD-10-CM | POA: Diagnosis not present

## 2020-11-14 DIAGNOSIS — F5105 Insomnia due to other mental disorder: Secondary | ICD-10-CM | POA: Diagnosis not present

## 2020-11-14 DIAGNOSIS — F902 Attention-deficit hyperactivity disorder, combined type: Secondary | ICD-10-CM

## 2020-11-14 DIAGNOSIS — F410 Panic disorder [episodic paroxysmal anxiety] without agoraphobia: Secondary | ICD-10-CM | POA: Diagnosis not present

## 2020-11-14 MED ORDER — LAMOTRIGINE 100 MG PO TABS: ORAL_TABLET | ORAL | 1 refills | Status: DC

## 2020-11-14 MED ORDER — AMPHETAMINE-DEXTROAMPHET ER 30 MG PO CP24: 30.0000 mg | ORAL_CAPSULE | Freq: Every day | ORAL | 0 refills | Status: DC

## 2020-11-14 MED ORDER — LAMOTRIGINE 25 MG PO TABS: ORAL_TABLET | ORAL | 0 refills | Status: DC

## 2020-11-14 MED ORDER — ALPRAZOLAM 2 MG PO TABS: 2.0000 mg | ORAL_TABLET | Freq: Every day | ORAL | 1 refills | Status: DC | PRN

## 2020-11-14 NOTE — Progress Notes (Signed)
Bethany Terry [id number] 08-06-2000 20 y.o.  Subjective:   Patient ID:  Bethany Terry is a 20 y.o. (DOB 05/19/2000) female.  Chief Complaint:  Chief Complaint  Patient presents with   Anxiety   Depression   Insomnia   ADHD    HPI Bethany Terry presents to the office today for follow-up of anxiety, depression, and ADHD. She reports that she has been traveling often. Had hospital visit while traveling. Has been having severe pain and is awaiting referral to orthopedic specialist. "I know something is wrong with me." Reports frustration in response to unexplained physical s/s. She reports that she has been more stressed. Has had depression in response to these issues. Reports "I had been doing really well before all this."   Sleep has been "on and off." Sleep interrupted by pain. Appetite has been ok. Energy has been lower. Motivation has been low. Concentration has improved with Adderall and otherwise is procrastinating. Denies any change in interests. Denies SI.    Adderall XR Vyvanse Ritalin Xanax Propranolol Cymbalta Sertraline Celexa Prozac Lexapro Wellbutrin XL- Somewhat helpful Strattera Lamictal Trazodone- Caused hallucinations Clonidine   PHQ2-9    Flowsheet Row Office Visit from 02/17/2017 in Primary Care at [facility] Visit from 11/19/2016 in Primary Care at [facility] Visit from 10/08/2016 in Primary Care at [facility] Visit from 02/21/2015 in Primary Care at [facility] Visit from 01/19/2015 in Primary Care at [facility] Total Score 0 0 0 0 0        Review of Systems:  Review of Systems  Cardiovascular:  Positive for chest pain.  Endocrine:       Reports possible hypoglycemia  Genitourinary:  Positive for hematuria and menstrual problem.  Musculoskeletal:  Positive for back pain. Negative for gait problem.  Neurological:  Negative for tremors.  Psychiatric/Behavioral:         Please refer to HPI   Medications: I have reviewed the patient's  current medications.  Current Outpatient Medications  Medication Sig Dispense Refill   albuterol (VENTOLIN HFA) 108 (90 Base) MCG/ACT inhaler Inhale 2 puffs into the lungs every 6 (six) hours as needed for wheezing. 1 Inhaler 0   amphetamine-dextroamphetamine (ADDERALL XR) 30 MG 24 hr capsule Take 1 capsule (30 mg total) by mouth daily after breakfast. 30 capsule 0   etonogestrel (NEXPLANON) 68 MG IMPL implant Nexplanon 68 mg subdermal implant  Inject 1 implant by subcutaneous route.     lamoTRIgine (LAMICTAL) 100 MG tablet Take 1 tab po qd x 2 weeks, then increase to 1.5 tabs po qd 45 tablet 1   lamoTRIgine (LAMICTAL) 25 MG tablet Take 1 tablet (25 mg total) by mouth daily for 14 days, THEN 2 tablets (50 mg total) daily for 14 days. 45 tablet 0   [START ON 12/18/2020] alprazolam (XANAX) 2 MG tablet Take 1 tablet (2 mg total) by mouth daily as needed for anxiety. 30 tablet 1   [START ON 01/09/2021] amphetamine-dextroamphetamine (ADDERALL XR) 30 MG 24 hr capsule Take 1 capsule (30 mg total) by mouth daily after breakfast. 30 capsule 0   [START ON 12/12/2020] amphetamine-dextroamphetamine (ADDERALL XR) 30 MG 24 hr capsule Take 1 capsule (30 mg total) by mouth daily after breakfast. 30 capsule 0   Fluticasone-Salmeterol (ADVAIR) 250-50 MCG/DOSE AEPB Inhale into the lungs. (Patient not taking: Reported on 05/31/2020)     No current facility-administered medications for this visit.    Medication Side Effects: None  Allergies:  Allergies  Allergen Reactions  Buspar [Buspirone] Rash   Naproxen Rash    Past Medical History:  Diagnosis Date   Anxiety    Arthritis    Depression    Insomnia     Past Medical History, Surgical history, Social history, and Family history were reviewed and updated as appropriate.   Please see review of systems for further details on the patient's review from today.   Objective:   Physical Exam:  There were no vitals taken for this visit.  Physical  Exam Constitutional:      General: She is not in acute distress. Musculoskeletal:        General: No deformity.  Neurological:     Mental Status: She is alert and oriented to person, place, and time.     Coordination: Coordination normal.  Psychiatric:        Attention and Perception: Attention and perception normal. She does not perceive auditory or visual hallucinations.        Mood and Affect: Mood is anxious and depressed. Affect is not labile, blunt, angry or inappropriate.        Speech: Speech normal.        Behavior: Behavior normal.        Thought Content: Thought content normal. Thought content is not paranoid or delusional. Thought content does not include homicidal or suicidal ideation. Thought content does not include homicidal or suicidal plan.        Cognition and Memory: Cognition and memory normal.        Judgment: Judgment normal.     Comments: Insight intact    Lab Review:     Component Value Date/Time   NA 140 09/08/2019 0006   K 3.6 09/08/2019 0006   CL 103 09/08/2019 0006   CO2 23 09/08/2019 0006   GLUCOSE 121 (H) 09/08/2019 0006   BUN 14 09/08/2019 0006   CREATININE 0.86 09/08/2019 0006   CALCIUM 9.5 09/08/2019 0006   PROT 7.7 10/10/2017 1811   ALBUMIN 4.2 10/10/2017 1811   AST 23 10/10/2017 1811   ALT 18 10/10/2017 1811   ALKPHOS 57 10/10/2017 1811   BILITOT 0.7 10/10/2017 1811   GFRNONAA >60 09/08/2019 0006   GFRAA >60 09/08/2019 0006       Component Value Date/Time   WBC 9.7 09/08/2019 0006   RBC 5.11 09/08/2019 0006   HGB 14.3 09/08/2019 0006   HCT 41.5 09/08/2019 0006   PLT 270 09/08/2019 0006   MCV 81.2 09/08/2019 0006   MCH 28.0 09/08/2019 0006   MCHC 34.5 09/08/2019 0006   RDW 11.7 09/08/2019 0006    No results found for: POCLITH, LITHIUM   No results found for: PHENYTOIN, PHENOBARB, VALPROATE, CBMZ   .res Assessment: Plan:    Counseled patient regarding potential benefits, risks, and side effects of Lamictal to include  potential risk of Stevens-Johnson syndrome. Advised patient to stop taking Lamictal and contact office immediately if rash develops and to seek urgent medical attention if rash is severe and/or spreading quickly. Will start Lamictal 25 mg daily for 2 weeks, then increase to 50 mg daily for 2 weeks, then 100 mg daily for 2 weeks, then 150 mg daily for mood symptoms.  Continue Xanax 2 mg po qd prn anxiety.  Continue Adderall XR 30 mg po qd for ADHD.  Pt to follow-up in 2 months or sooner if clinically indicated.  Patient advised to contact office with any questions, adverse effects, or acute worsening in signs and symptoms.   Bethany Terry was  seen today for anxiety, depression, insomnia and adhd.  Diagnoses and all orders for this visit:  Persistent depressive disorder with atypical features, currently moderate -     lamoTRIgine (LAMICTAL) 25 MG tablet; Take 1 tablet (25 mg total) by mouth daily for 14 days, THEN 2 tablets (50 mg total) daily for 14 days. -     lamoTRIgine (LAMICTAL) 100 MG tablet; Take 1 tab po qd x 2 weeks, then increase to 1.5 tabs po qd  Generalized anxiety disorder -     alprazolam (XANAX) 2 MG tablet; Take 1 tablet (2 mg total) by mouth daily as needed for anxiety.  Panic disorder -     alprazolam (XANAX) 2 MG tablet; Take 1 tablet (2 mg total) by mouth daily as needed for anxiety.  Insomnia disorder, with non-sleep disorder mental comorbidity, episodic -     alprazolam (XANAX) 2 MG tablet; Take 1 tablet (2 mg total) by mouth daily as needed for anxiety.  Attention deficit hyperactivity disorder (ADHD), combined type, moderate -     amphetamine-dextroamphetamine (ADDERALL XR) 30 MG 24 hr capsule; Take 1 capsule (30 mg total) by mouth daily after breakfast. -     amphetamine-dextroamphetamine (ADDERALL XR) 30 MG 24 hr capsule; Take 1 capsule (30 mg total) by mouth daily after breakfast.    Please see After Visit Summary for patient specific instructions.  Future  Appointments  Date Time Provider [place]  11/20/2020 12:00 PM [name], [facility] CP-CP None  12/06/2020  9:00 AM [name], [employer] [facility] CP-CP None  01/14/2021  9:00 AM Thayer Headings, PMHNP CP-CP None    No orders of the defined types were placed in this encounter.   -------------------------------

## 2020-11-20 ENCOUNTER — Other Ambulatory Visit: Payer: Self-pay

## 2020-11-20 ENCOUNTER — Encounter: Payer: Self-pay | Admitting: Psychiatry

## 2020-11-20 ENCOUNTER — Ambulatory Visit (INDEPENDENT_AMBULATORY_CARE_PROVIDER_SITE_OTHER): Payer: 59 | Admitting: Psychiatry

## 2020-11-20 DIAGNOSIS — F411 Generalized anxiety disorder: Secondary | ICD-10-CM

## 2020-11-20 NOTE — Progress Notes (Signed)
      [facility] Counselor/Therapist Progress Note  Patient ID: Bethany Terry, MRN: [id number],    Date: 11/20/2020  Time Spent: 50 minutes start time 12:03 PM end time 12:53 PM  Treatment Type: Individual Therapy  Reported Symptoms: fatigue, sad, anxious, anger, crying spells, triggered responses, medical issues, migraines  Mental Status Exam:  Appearance:   Well Groomed     Behavior:  Appropriate  Motor:  Normal  Speech/Language:   Normal Rate  Affect:  Appropriate tearful  Mood:  sad  Thought process:  normal  Thought content:    WNL  Sensory/Perceptual disturbances:    WNL  Orientation:  oriented to person, place, time/date, and situation  Attention:  Good  Concentration:  Good  Memory:  WNL  Fund of knowledge:   Good  Insight:    Good  Judgment:   Good  Impulse Control:  Good   Risk Assessment: Danger to Self:  No Self-injurious Behavior: No Danger to Others: No Duty to Warn:no Physical Aggression / Violence:No  Access to Firearms a concern: No  Gang Involvement:No   Subjective: Patient was present for session. She shared that she is realizing she has been at a dark place for a while.  She stated she needs to realize she needs to not control all of her feelings and thoughts.  She went on to share that as she processes things she realizes her mom is a part of most of her memories.  She shared that she loves her mother but knows that she needs to distance from her for her own mental health which is very difficult.  Patient did processing set on mom not letting go, suds level 10, negative cognition "I am not respected" felt irritation disappointment and overwhelmed in her heart.  Patient was able to reduce suds level to 5.  She was able to recognize that there are things she can do to set limits even if her mother has difficulty with being here she needs her to be.  Interventions: Solution-Oriented/Positive Psychology, Insight-Oriented, and BS P  Diagnosis:   ICD-10-CM    1. Generalized anxiety disorder  F41.1       Plan: Patient is to use CBT and coping skills to decrease anxiety symptoms.  Patient is to continue working on limit setting with her mother.  Patient is to exercise regularly to release negative emotions appropriately.  Patient is to take medication as directed. Long-term goal: Resolve the core conflict that is the source of anxiety Short-term goal: Describe current and past experiences with specific fears prominent worries and anxiety symptoms including their impact on functioning and attempts to resolve it  [name], [facility]

## 2020-12-06 ENCOUNTER — Ambulatory Visit (INDEPENDENT_AMBULATORY_CARE_PROVIDER_SITE_OTHER): Payer: 59 | Admitting: Psychiatry

## 2020-12-06 DIAGNOSIS — F411 Generalized anxiety disorder: Secondary | ICD-10-CM | POA: Diagnosis not present

## 2020-12-06 NOTE — Progress Notes (Signed)
[facility] Counselor/Therapist Progress Note  Patient ID: Bethany Terry, MRN: [id number],    Date: 12/06/2020  Time Spent: 48 minutes start time 9:12 AM end time 10 AM Virtual Visit via Video Note Connected with patient by a telemedicine/telehealth application, with their informed consent, and verified patient privacy and that I am speaking with the correct person using two identifiers. I discussed the limitations, risks, security and privacy concerns of performing psychotherapy and the availability of in person appointments. I also discussed with the patient that there may be a patient responsible charge related to this service. The patient expressed understanding and agreed to proceed. I discussed the treatment planning with the patient. The patient was provided an opportunity to ask questions and all were answered. The patient agreed with the plan and demonstrated an understanding of the instructions. The patient was advised to call  our office if  symptoms worsen or feel they are in a crisis state and need immediate contact.   Therapist Location: office Patient Location: in [place]    Treatment Type: Individual Therapy  Reported Symptoms: fatigue, anxiety, muscle spasms, overwhelmed, sadness, lonely, triggered responses, migraines  Mental Status Exam:  Appearance:   Casual and Neat     Behavior:  Appropriate  Motor:  Normal  Speech/Language:   Normal Rate  Affect:  Appropriate  Mood:  anxious and sad  Thought process:  normal  Thought content:    WNL  Sensory/Perceptual disturbances:    WNL  Orientation:  oriented to person, place, time/date, and situation  Attention:  Good  Concentration:  Good  Memory:  WNL  Fund of knowledge:   Good  Insight:    Good  Judgment:   Good  Impulse Control:  Good   Risk Assessment: Danger to Self:  No Self-injurious Behavior: No Danger to Others: No Duty to Warn:no Physical Aggression / Violence:No  Access to Firearms a  concern: No  Gang Involvement:No   Subjective: Met with patient via virtual session.  She was out of town for work.  She shared that things have been overwhelming for her with work having her travel all over.  She shared that it got to the point that she started having muscle spasms.  She shared that she and her boyfriend are struggling due to him not being able to be there with her family.  She explained that she wanted him to meet some of her family but he reported just not feeling comfortable at this time because he is not where he needs to be.  Allowed patient time to process what she was feeling and thinking and she was able to develop some plans on how she wanted to communicate with him after he has some things resolved on December 15.  Was able to recognize that he is similar to her father in many ways both positive and negative and that there are things concerning her father that she does not want to live with long-term so she needs to address this with her boyfriend.  Was also able to recognize that she needs to focus more on her own self-care and that she continues to have medical issues and migraines regularly that have to be addressed.  Discussed ways that she can talk herself through that and the reason she has to focus on that self-care.  Interventions: Cognitive Behavioral Therapy and Solution-Oriented/Positive Psychology  Diagnosis:   ICD-10-CM   1. Generalized anxiety disorder  F41.1       Plan:  Patient is to use CBT and coping skills to continue decreasing anxiety symptoms.  Patient is to have a conversation with her boyfriend after December 15 concerning her needs and concerns.  Patient is to work on medical issues and take medication as directed.  Patient is to exercise to release negative emotions appropriately. Long-term goal: Resolve the core conflict that is the source of anxiety Short-term goal: Describe current and past experiences with specific fears prominent worries and  anxiety symptoms including their impact on functioning and attempts to resolve it  [name], [facility]

## 2020-12-17 ENCOUNTER — Other Ambulatory Visit: Payer: Self-pay

## 2020-12-17 ENCOUNTER — Ambulatory Visit (INDEPENDENT_AMBULATORY_CARE_PROVIDER_SITE_OTHER): Payer: 59 | Admitting: Psychiatry

## 2020-12-17 DIAGNOSIS — F411 Generalized anxiety disorder: Secondary | ICD-10-CM
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
## 2021-01-08 ENCOUNTER — Ambulatory Visit (INDEPENDENT_AMBULATORY_CARE_PROVIDER_SITE_OTHER): Payer: 59 | Admitting: Psychiatry

## 2021-01-08 ENCOUNTER — Other Ambulatory Visit: Payer: Self-pay

## 2021-01-08 DIAGNOSIS — F411 Generalized anxiety disorder: Secondary | ICD-10-CM
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
## 2021-01-10 ENCOUNTER — Ambulatory Visit: Payer: 59 | Admitting: Psychiatry

## 2021-01-14 ENCOUNTER — Ambulatory Visit: Payer: 59 | Admitting: Psychiatry

## 2021-01-28 ENCOUNTER — Ambulatory Visit (INDEPENDENT_AMBULATORY_CARE_PROVIDER_SITE_OTHER): Payer: 59 | Admitting: Psychiatry

## 2021-01-28 ENCOUNTER — Other Ambulatory Visit: Payer: Self-pay

## 2021-01-28 DIAGNOSIS — F411 Generalized anxiety disorder: Secondary | ICD-10-CM | POA: Diagnosis not present

## 2021-01-28 NOTE — Progress Notes (Signed)
°      [facility] Counselor/Therapist Progress Note  Patient ID: Bethany Terry, MRN: [id number],    Date: 01/28/2021  Time Spent: 50 minutes 8:06 AM end time 8:56 AM  Treatment Type: Individual Therapy  Reported Symptoms: anxiety, focusing issues, sadness, migraines, sleep issues, irritability  Mental Status Exam:  Appearance:   Well Groomed     Behavior:  Appropriate  Motor:  Normal  Speech/Language:   Normal Rate  Affect:  Appropriate  Mood:  normal  Thought process:  normal  Thought content:    WNL  Sensory/Perceptual disturbances:    WNL  Orientation:  oriented to person, place, time/date, and situation  Attention:  Good  Concentration:  Good  Memory:  WNL  Fund of knowledge:   Good  Insight:    Good  Judgment:   Good  Impulse Control:  Good   Risk Assessment: Danger to Self:  No Self-injurious Behavior: No Danger to Others: No Duty to Warn:no Physical Aggression / Violence:No  Access to Firearms a concern: No  Gang Involvement:No   Subjective: Patient was present for session.  She shared that she has been trying to help her father and it has been hard.  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  Allowed patient to process some of the different incident out of her situation.  Patient went on to explain she is struggling with her boyfriend.  And she knows that things will be moving towards them getting married soon as things progressed with him potentially going into the [employer].  Patient was given time to process what she is feeling and encouraged to realize she has to do what is going be best for her and if that means putting things on hold until she sees how he is going to progress that is okay.  Patient was encouraged to recognize what she needs for the relationship and to communicate that with her boyfriend.  The importance of her self-care during this time was discussed with patient.   She shared she is having some difficult medical issues she was encouraged to follow up with a neurologist on those concerns.  She was also encouraged to exercise regularly to release the negative emotions appropriately.  Interventions: Cognitive Behavioral Therapy and Solution-Oriented/Positive Psychology  Diagnosis:   ICD-10-CM   1. Generalized anxiety disorder  F41.1       Plan: Patient is to use CBT and coping skills to decrease anxiety symptoms.  Patient is to consider what she wants for her future and communicate that with her boyfriend.  Is to pursue a neurologist to deal with her medical issues.  Patient is to exercise to release negative emotions appropriately.  Patient is to take medication as directed. Long-term goal: Resolve the core conflict that is the source of anxiety Short-term goal: Describe current and past experiences with specific fears prominent worries and anxiety symptoms including their impact on functioning and attempts to resolve it  [name], [facility]

## 2021-02-14 ENCOUNTER — Telehealth: Payer: Self-pay | Admitting: Psychiatry

## 2021-02-14 DIAGNOSIS — F5105 Insomnia due to other mental disorder: Secondary | ICD-10-CM

## 2021-02-14 DIAGNOSIS — F411 Generalized anxiety disorder: Secondary | ICD-10-CM

## 2021-02-14 DIAGNOSIS — F410 Panic disorder [episodic paroxysmal anxiety] without agoraphobia: Secondary | ICD-10-CM

## 2021-02-14 DIAGNOSIS — F902 Attention-deficit hyperactivity disorder, combined type: Secondary | ICD-10-CM

## 2021-02-14 DIAGNOSIS — F341 Dysthymic disorder: Secondary | ICD-10-CM

## 2021-02-14 MED ORDER — LAMOTRIGINE 100 MG PO TABS: ORAL_TABLET | ORAL | 1 refills | Status: DC

## 2021-02-14 MED ORDER — LAMOTRIGINE 25 MG PO TABS: ORAL_TABLET | ORAL | 0 refills | Status: DC

## 2021-02-14 MED ORDER — ALPRAZOLAM 2 MG PO TABS: 2.0000 mg | ORAL_TABLET | Freq: Every day | ORAL | 1 refills | Status: DC | PRN

## 2021-02-14 MED ORDER — AMPHETAMINE-DEXTROAMPHET ER 30 MG PO CP24: 30.0000 mg | ORAL_CAPSULE | Freq: Every day | ORAL | 0 refills | Status: DC

## 2021-02-14 NOTE — Telephone Encounter (Signed)
Bethany Terry called because she has had issues with her insurance covering her medications. She has that all straightened out now so her medications can now be resubmitted so she can pick them up.  It has been about a month since she has taken her medications.  She was on lamictal 100mg  but since it has been so long she is thinking she will have to start over at 25mg .  Also send the Xanax and Adderall XR.  She called the pharmacy and they told her they had it in stock.  [place] to [facility] [place] by [employer].

## 2021-02-14 NOTE — Telephone Encounter (Signed)
Please let her know that she will need to start Lamictal at 25 mg and increase every 2 weeks. Scripts sent to pharmacy.

## 2021-02-14 NOTE — Telephone Encounter (Signed)
Please advise lamictal dose

## 2021-02-15 NOTE — Telephone Encounter (Signed)
Pt informed

## 2021-02-25 ENCOUNTER — Ambulatory Visit: Payer: 59 | Admitting: Psychiatry

## 2021-02-26 ENCOUNTER — Ambulatory Visit: Payer: 59 | Admitting: Psychiatry

## 2021-03-11 ENCOUNTER — Ambulatory Visit (INDEPENDENT_AMBULATORY_CARE_PROVIDER_SITE_OTHER): Payer: 59 | Admitting: Psychiatry

## 2021-03-11 ENCOUNTER — Other Ambulatory Visit: Payer: Self-pay

## 2021-03-11 DIAGNOSIS — F411 Generalized anxiety disorder: Secondary | ICD-10-CM

## 2021-03-11 NOTE — Progress Notes (Signed)
?      [facility] Counselor/Therapist Progress Note ? ?Patient ID: Taiylor Kayal, MRN: [id number],   ? ?Date: 03/11/2021 ? ?Time Spent: 51 minutes start time 11:07 AM end time 11:58 AM ? ?Treatment Type: Individual Therapy ? ?Reported Symptoms: anxiety, sleep issues, sadness, overwhelmed, medical issues, fatigue ? ?Mental Status Exam: ? ?Appearance:   Casual and Neat     ?Behavior:  Appropriate  ?Motor:  Normal  ?Speech/Language:   Normal Rate  ?Affect:  Appropriate  ?Mood:  anxious  ?Thought process:  normal  ?Thought content:    WNL  ?Sensory/Perceptual disturbances:    WNL  ?Orientation:  oriented to person, place, time/date, and situation  ?Attention:  Good  ?Concentration:  Good  ?Memory:  WNL  ?Fund of knowledge:   Good  ?Insight:    Good  ?Judgment:   Good  ?Impulse Control:  Good  ? ?Risk Assessment: ?Danger to Self:  No ?Self-injurious Behavior: No ?Danger to Others: No ?Duty to Warn:no ?Physical Aggression / Violence:No  ?Access to Firearms a concern: No  ?Gang Involvement:No  ? ?Subjective: Patient was present for session.  She shared she has been taking her medication as directed and she feels it has been helping. She is going to take a new job and she and her boyfriend are planning on moving out on their own again.  She shared she is overwhelmed with all she has to do and the changing.  Patient was encouraged to use her coping skills and self talk to keep herself at a good place even with all the difficult changes she is undergoing through currently.  Patient was reminded of all the progress she is made.  She shared she is doing much better with needles and has been able to get lots of blood drawn through all of the testing she is having to go through with her medical situation.  Patient reported she is also found out there is a history of lupus in her family and she plans on sharing that with her physician.  Patient was encouraged to recognize that she has to focus on the things she can control fix and  change and that she is doing amazing considering her whole situation. ? ?Interventions: Cognitive Behavioral Therapy and Solution-Oriented/Positive Psychology ? ?Diagnosis: ?  ICD-10-CM   ?1. Generalized anxiety disorder  F41.1   ?  ? ? ?Plan: Patient is to use CBT and coping skills to decrease anxiety symptoms.  Patient is to focus on what she can control fix and change.  Patient is to continue working with medical providers on her medical issues.  Patient is to exercise to release negative emotions appropriately.  Patient is to take medication as directed. ?Long-term goal: Resolve the core conflict that is the source of anxiety ?Short-term goal: Describe current and past experiences with specific fears prominent worries and anxiety symptoms including their impact on functioning and attempts to resolve it ?  ? ?[name], [facility] ? ? ? ? ? ? ? ? ? ? ? ? ? ? ? ? ? ? ?

## 2021-04-09 ENCOUNTER — Encounter: Payer: Self-pay | Admitting: Psychiatry

## 2021-04-09 ENCOUNTER — Ambulatory Visit (INDEPENDENT_AMBULATORY_CARE_PROVIDER_SITE_OTHER): Payer: 59 | Admitting: Psychiatry

## 2021-04-09 DIAGNOSIS — F410 Panic disorder [episodic paroxysmal anxiety] without agoraphobia: Secondary | ICD-10-CM

## 2021-04-09 DIAGNOSIS — F341 Dysthymic disorder: Secondary | ICD-10-CM

## 2021-04-09 DIAGNOSIS — F902 Attention-deficit hyperactivity disorder, combined type: Secondary | ICD-10-CM

## 2021-04-09 DIAGNOSIS — F411 Generalized anxiety disorder: Secondary | ICD-10-CM | POA: Diagnosis not present

## 2021-04-09 DIAGNOSIS — F5105 Insomnia due to other mental disorder: Secondary | ICD-10-CM | POA: Diagnosis not present

## 2021-04-09 MED ORDER — AMPHETAMINE-DEXTROAMPHET ER 30 MG PO CP24: 30.0000 mg | ORAL_CAPSULE | Freq: Every day | ORAL | 0 refills | Status: DC

## 2021-04-09 MED ORDER — LAMOTRIGINE 150 MG PO TABS: 150.0000 mg | ORAL_TABLET | Freq: Every day | ORAL | 1 refills | Status: DC

## 2021-04-09 MED ORDER — ALPRAZOLAM 2 MG PO TABS: 2.0000 mg | ORAL_TABLET | Freq: Every day | ORAL | 2 refills | Status: DC | PRN

## 2021-04-09 NOTE — Progress Notes (Signed)
Bethany Terry ?[id number] ?2000-08-28 ?21 y.o. ? ?Subjective:  ? ?Patient ID:  Bethany Terry is a 21 y.o. (DOB 23-Dec-2000) female. ? ?Chief Complaint:  ?Chief Complaint  ?Patient presents with  ? Follow-up  ?  Anxiety, depression, ADHD, sleep disturbance  ? ? ?HPI ?Bethany Terry presents to the office today for follow-up of depression, anxiety, ADHD, and insomnia. She reports that she feels, "good, mentally." She reports that Lamictal has been helpful for depressive s/s. Denies any recent severe sadness and irritability. She reports that irritability has improved. She reports continued stressors and that her anxiety has been manageable. Describes sleep as "here and there." Reports frequent middle of the night awakenings and wakes up feeling "exhausted." Denies difficulty falling asleep. She reports that she thinks she snores. No known episodes of apnea. She has frequent nightmares, typically after taking Xanax. Denies any recent panic attacks. Denies excessive somnolence. Motivation has been "here and there." Motivation is difficult at times due to fatigue. She reports that she has been taking better care of her physical health. She reports difficulty focusing at times due to fatigue. Denies SI.  ? ?She notices some "fog" and difficulty remembering things and uses notes to keep track of things. Reports that she has been eating healthier foods. She reports that she has had severe migraines at times. Has apt to see rheumatologist next week.  ? ?Working as an Programmer, applications for a Veterinary surgeon. They are living with her father, who is a severe hoarder. She has been trying to help him get rid of things. Has not had any contact with mother for the last 3 months.  ? ?Adderall XR ?Vyvanse ?Ritalin ?Xanax ?Propranolol ?Cymbalta ?Sertraline ?Celexa ?Prozac ?Lexapro ?Wellbutrin XL- Somewhat helpful ?Strattera ?Lamictal ?Trazodone- Caused hallucinations ?Clonidine ?  ? ? ?PHQ2-9   ? ?[place] Office Visit from  02/17/2017 in Primary Care at [place] from 11/19/2016 in Primary Care at [place] from 10/08/2016 in Primary Care at [place] from 02/21/2015 in Primary Care at [place] from 01/19/2015 in [place] [facility]  ?PHQ-2 Total Score 0 0 0 0 0  ? ?  ?  ? ?Review of Systems:  ?Review of Systems  ?Endocrine:  ?     Glucose levels have been elevated  ?Musculoskeletal:  Positive for arthralgias. Negative for gait problem.  ?Skin:   ?     Occ intermittent rashes that were present prior to Lamictal.   ?Neurological:  Positive for headaches.  ?Psychiatric/Behavioral:    ?     Please refer to HPI  ? ?Medications: I have reviewed the patient's current medications. ? ?Current Outpatient Medications  ?Medication Sig Dispense Refill  ? albuterol (VENTOLIN HFA) 108 (90 Base) MCG/ACT inhaler Inhale 2 puffs into the lungs every 6 (six) hours as needed for wheezing. 1 Inhaler 0  ? [START ON 04/28/2021] alprazolam (XANAX) 2 MG tablet Take 1 tablet (2 mg total) by mouth daily as needed for anxiety. 30 tablet 2  ? [START ON 06/04/2021] amphetamine-dextroamphetamine (ADDERALL XR) 30 MG 24 hr capsule Take 1 capsule (30 mg total) by mouth daily after breakfast. 30 capsule 0  ? [START ON 05/07/2021] amphetamine-dextroamphetamine (ADDERALL XR) 30 MG 24 hr capsule Take 1 capsule (30 mg total) by mouth daily after breakfast. 30 capsule 0  ? amphetamine-dextroamphetamine (ADDERALL XR) 30 MG 24 hr capsule Take 1 capsule (30 mg total) by mouth daily after breakfast. 30 capsule 0  ? etonogestrel (NEXPLANON) 68  MG IMPL implant Nexplanon 68 mg subdermal implant ? Inject 1 implant by subcutaneous route.    ? Fluticasone-Salmeterol (ADVAIR) 250-50 MCG/DOSE AEPB Inhale into the lungs. (Patient not taking: Reported on 05/31/2020)    ? lamoTRIgine (LAMICTAL) 150 MG tablet Take 1 tablet (150 mg total) by mouth daily. 90 tablet 1  ? ?No current facility-administered medications for this visit.  ? ? ?Medication Side  Effects: None ? ?Allergies:  ?Allergies  ?Allergen Reactions  ? Buspar [Buspirone] Rash  ? Naproxen Rash  ? ? ?Past Medical History:  ?Diagnosis Date  ? Anxiety   ? Arthritis   ? Depression   ? Insomnia   ? ? ?Past Medical History, Surgical history, Social history, and Family history were reviewed and updated as appropriate.  ? ?Please see review of systems for further details on the patient's review from today.  ? ?Objective:  ? ?Physical Exam:  ?BP 116/64   Pulse 94  ? ?Physical Exam ?Constitutional:   ?   General: She is not in acute distress. ?Musculoskeletal:     ?   General: No deformity.  ?Neurological:  ?   Mental Status: She is alert and oriented to person, place, and time.  ?   Coordination: Coordination normal.  ?Psychiatric:     ?   Attention and Perception: Attention and perception normal. She does not perceive auditory or visual hallucinations.     ?   Mood and Affect: Mood normal. Mood is not anxious or depressed. Affect is not labile, blunt, angry or inappropriate.     ?   Speech: Speech normal.     ?   Behavior: Behavior normal.     ?   Thought Content: Thought content normal. Thought content is not paranoid or delusional. Thought content does not include homicidal or suicidal ideation. Thought content does not include homicidal or suicidal plan.     ?   Cognition and Memory: Cognition and memory normal.     ?   Judgment: Judgment normal.  ?   Comments: Insight intact  ? ? ?Lab Review:  ?   ?Component Value Date/Time  ? NA 140 09/08/2019 0006  ? K 3.6 09/08/2019 0006  ? CL 103 09/08/2019 0006  ? CO2 23 09/08/2019 0006  ? GLUCOSE 121 (H) 09/08/2019 0006  ? BUN 14 09/08/2019 0006  ? CREATININE 0.86 09/08/2019 0006  ? CALCIUM 9.5 09/08/2019 0006  ? PROT 7.7 10/10/2017 1811  ? ALBUMIN 4.2 10/10/2017 1811  ? AST 23 10/10/2017 1811  ? ALT 18 10/10/2017 1811  ? ALKPHOS 57 10/10/2017 1811  ? BILITOT 0.7 10/10/2017 1811  ? GFRNONAA >60 09/08/2019 0006  ? GFRAA >60 09/08/2019 0006  ? ? ?   ?Component Value  Date/Time  ? WBC 9.7 09/08/2019 0006  ? RBC 5.11 09/08/2019 0006  ? HGB 14.3 09/08/2019 0006  ? HCT 41.5 09/08/2019 0006  ? PLT 270 09/08/2019 0006  ? MCV 81.2 09/08/2019 0006  ? MCH 28.0 09/08/2019 0006  ? MCHC 34.5 09/08/2019 0006  ? RDW 11.7 09/08/2019 0006  ? ? ?No results found for: POCLITH, LITHIUM  ? ?No results found for: PHENYTOIN, PHENOBARB, VALPROATE, CBMZ  ? ?.res ?Assessment: Plan:   ?Will continue current plan of care since target signs and symptoms are well controlled without any tolerability issues. ?Continue Adderall XR 30 mg po q am for ADHD.  ?Continue Xanax 2 mg po QHS prn anxiety/insomnia. ?Continue Lamictal 150 mg po qd for mood disturbance.  ?Pt  to follow-up in 3 months or sooner if clinically indicated.  ?Patient advised to contact office with any questions, adverse effects, or acute worsening in signs and symptoms. ?Recommend continuing therapy with [name], [facility].  ? ? ?Bethany Terry was seen today for follow-up. ? ?Diagnoses and all orders for this visit: ? ?Attention deficit hyperactivity disorder (ADHD), combined type, moderate ?-     amphetamine-dextroamphetamine (ADDERALL XR) 30 MG 24 hr capsule; Take 1 capsule (30 mg total) by mouth daily after breakfast. ?-     amphetamine-dextroamphetamine (ADDERALL XR) 30 MG 24 hr capsule; Take 1 capsule (30 mg total) by mouth daily after breakfast. ?-     amphetamine-dextroamphetamine (ADDERALL XR) 30 MG 24 hr capsule; Take 1 capsule (30 mg total) by mouth daily after breakfast. ? ?Generalized anxiety disorder ?-     alprazolam (XANAX) 2 MG tablet; Take 1 tablet (2 mg total) by mouth daily as needed for anxiety. ? ?Panic disorder ?-     alprazolam (XANAX) 2 MG tablet; Take 1 tablet (2 mg total) by mouth daily as needed for anxiety. ? ?Insomnia disorder, with non-sleep disorder mental comorbidity, episodic ?-     alprazolam (XANAX) 2 MG tablet; Take 1 tablet (2 mg total) by mouth daily as needed for anxiety. ? ?Persistent depressive disorder with  atypical features, currently moderate ?-     lamoTRIgine (LAMICTAL) 150 MG tablet; Take 1 tablet (150 mg total) by mouth daily. ? ?  ? ?Please see After Visit Summary for patient specific instructions. ? ?Future Ap

## 2021-04-23 ENCOUNTER — Ambulatory Visit (INDEPENDENT_AMBULATORY_CARE_PROVIDER_SITE_OTHER): Payer: 59 | Admitting: Psychiatry

## 2021-04-23 DIAGNOSIS — F411 Generalized anxiety disorder: Secondary | ICD-10-CM

## 2021-04-23 NOTE — Progress Notes (Signed)
?    [facility] Counselor/Therapist Progress Note ? ?Patient ID: Bethany Terry, MRN: [id number],   ? ?Date: 04/23/2021 ? ?Time Spent: 59 minutes start time 8:05 AM end time 9:04 AM ? ?Treatment Type: Individual Therapy ? ?Reported Symptoms: health issues, anxiety, sadness, memory issues, focusing issues, fatigue, triggered responses ? ?Mental Status Exam: ? ?Appearance:   Well Groomed     ?Behavior:  Appropriate  ?Motor:  Normal  ?Speech/Language:   Normal Rate  ?Affect:  Appropriate and Tearful  ?Mood:  anxious and sad  ?Thought process:  normal  ?Thought content:    WNL  ?Sensory/Perceptual disturbances:    WNL  ?Orientation:  oriented to person, place, time/date, and situation  ?Attention:  Good  ?Concentration:  Good  ?Memory:  WNL  ?Fund of knowledge:   Good  ?Insight:    Good  ?Judgment:   Good  ?Impulse Control:  Good  ? ?Risk Assessment: ?Danger to Self:  No ?Self-injurious Behavior: No ?Danger to Others: No ?Duty to Warn:no ?Physical Aggression / Violence:No  ?Access to Firearms a concern: No  ?Gang Involvement:No  ? ?Subjective: Patient was present for session.  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  Patient explained she is having a hard time dealing with the whole situation because she is still going to have to undergo more testing including biopsies.  Patient explained not being able to do the things that she wants to do is very difficult for her.  She shared she is trying to come to terms with the fact that she is just not going to get her father's apartment clean before they move out.  Patient reported that her boyfriend and father are being very supportive during the situation but she is realize she has to limit contact with her mother.  She explained that when she did share what was going on with her her mother talked about her own health issues which was very triggering for her.  She also shared that she is working on maintaining  a healthy distance to keep herself at a better place.  She also shared that she is changed jobs which has been a positive thing for her even though she is not as busy as she is used to being and that sometimes is frustrating.  Encouraged patient to realize that at this time she may need to take things very slow and give her body opportunities to feel better rather than pushing herself.  Discussed the importance of keeping her brain focused on positive and trying to look at the good things during the situation.  Different ways to try and find joy in her life and to be comfortable with the changes in her body and what she is capable of doing were discussed with patient.  Patient was given information on the 21-day detox for the neuro cycle by Dr. De Burrs.  Patient was encouraged to look at some of her podcast and to work on the steps of keeping her thoughts moving in the right direction.  Patient reported she is managing the needles she is having to deal with with the blood testing.  Patient was encouraged to realize that she has made lots of progress in treatment and that things are moving in a more positive direction.  Different ways to continue making the progress were discussed with patient and she was encouraged to work on her self talk regularly. ? ?Interventions: Cognitive Behavioral  Therapy and Solution-Oriented/Positive Psychology ? ?Diagnosis: ?  ICD-10-CM   ?1. Generalized anxiety disorder  F41.1   ?  ? ? ?Plan: Patient is to use CBT and coping skills to decrease anxiety symptoms.  Patient is to focus on what she can control fix and change.  Patient is to continue working with medical providers on her medical issues.  Patient is to exercise to release negative emotions appropriately.  Patient is to work on the 21-day detox steps for the neuro cycle by Dr. Estevan Oaks to help make sure her thoughts are moving in a positive direction.  Patient is to take medication as directed. ?Long-term goal: Resolve  the core conflict that is the source of anxiety ?Short-term goal: Describe current and past experiences with specific fears prominent worries and anxiety symptoms including their impact on functioning and attempts to resolve it ? ?[name], [facility] ? ? ? ? ? ? ? ? ? ? ? ? ? ? ? ? ? ? ?

## 2021-05-13 ENCOUNTER — Ambulatory Visit: Payer: 59 | Admitting: Psychiatry

## 2021-05-14 ENCOUNTER — Ambulatory Visit (INDEPENDENT_AMBULATORY_CARE_PROVIDER_SITE_OTHER): Payer: 59 | Admitting: Psychiatry

## 2021-05-14 DIAGNOSIS — F411 Generalized anxiety disorder: Secondary | ICD-10-CM

## 2021-05-14 NOTE — Progress Notes (Signed)
?      [facility] Counselor/Therapist Progress Note ? ?Patient ID: Bethany Terry, MRN: [id number],   ? ?Date: 05/14/2021 ? ?Time Spent: 50 minutes start time 4:05 PM end time 4:55 PM ? ?Treatment Type: Individual Therapy ? ?Reported Symptoms: anxiety, health issues, panic attack, sadness ? ?Mental Status Exam: ? ?Appearance:   Well Groomed     ?Behavior:  Appropriate  ?Motor:  Normal  ?Speech/Language:   Normal Rate  ?Affect:  Appropriate  ?Mood:  anxious  ?Thought process:  normal  ?Thought content:    WNL  ?Sensory/Perceptual disturbances:    WNL  ?Orientation:  oriented to person, place, time/date, and situation  ?Attention:  Good  ?Concentration:  Good  ?Memory:  WNL  ?Fund of knowledge:   Good  ?Insight:    Good  ?Judgment:   Good  ?Impulse Control:  Good  ? ?Risk Assessment: ?Danger to Self:  No ?Self-injurious Behavior: No ?Danger to Others: No ?Duty to Warn:no ?Physical Aggression / Violence:No  ?Access to Firearms a concern: No  ?Gang Involvement:No  ? ?Subjective: Patient was present for session. She shared she had another passing out episode and ended up in the hospital. She went to [place] to see her friend over the weekend. She reported that her blood work is all over the place so she is having a hard time knowing what is going on with her.  She did share that she has been diagnosed with Lupus and kidney failure and possible liver failure. She shared that she is still having more testing.  Patient did report feeling that all the work with EMDR has made it so that she could not do her all the testing that she has had to endure.  She shared she is handling things to the best of her ability even though it is very upsetting to her.  Patient was able to discuss a couple experiences that got very triggering ways that she talked herself through them and things that she can do in the future were discussed with patient.  Patient was encouraged to continue considering allowing people and to help her.  The importance  of recognizing when she is getting too tired to drive was discussed with patient and she agreed that she would ask for help if she needs it. ? ?Interventions: Cognitive Behavioral Therapy, Solution-Oriented/Positive Psychology, and Insight-Oriented ? ?Diagnosis: ?  ICD-10-CM   ?1. Generalized anxiety disorder  F41.1   ?  ? ? ?Plan: Patient is to use CBT and coping skills to decrease anxiety symptoms.  Patient is to focus on what she can control fix and change.  Patient is to continue working with medical providers on her medical issues.  Patient is to exercise to release negative emotions appropriately.  Patient is to work on the 21-day detox steps for the neuro cycle by Dr. De Burrs to help make sure her thoughts are moving in a positive direction.  Patient is to take medication as directed. ?Long-term goal: Resolve the core conflict that is the source of anxiety ?Short-term goal: Describe current and past experiences with specific fears prominent worries and anxiety symptoms including their impact on functioning and attempts to resolve it ? ?[name], [facility] ? ? ? ? ? ? ? ? ? ? ? ? ? ? ? ? ? ? ?

## 2021-05-20 ENCOUNTER — Ambulatory Visit: Payer: 59 | Admitting: Psychiatry

## 2021-05-21 ENCOUNTER — Ambulatory Visit (INDEPENDENT_AMBULATORY_CARE_PROVIDER_SITE_OTHER): Payer: 59 | Admitting: Psychiatry

## 2021-05-21 DIAGNOSIS — F411 Generalized anxiety disorder: Secondary | ICD-10-CM

## 2021-05-21 NOTE — Progress Notes (Signed)
?    [facility] Counselor/Therapist Progress Note ? ?Patient ID: Bethany Terry, MRN: [id number],   ? ?Date: 05/21/2021 ? ?Time Spent: 50 minutes start time 8:06 AM end time 8:56 AM ? ?Treatment Type: Individual Therapy ? ?Reported Symptoms: anxiety, health issues, panic, chronic pain, fatigue, sadness ? ?Mental Status Exam: ? ?Appearance:   Well Groomed     ?Behavior:  Appropriate  ?Motor:  Normal  ?Speech/Language:   Normal Rate  ?Affect:  Appropriate  ?Mood:  anxious and sad  ?Thought process:  normal  ?Thought content:    WNL  ?Sensory/Perceptual disturbances:    WNL  ?Orientation:  oriented to person, place, time/date, and situation  ?Attention:  Good  ?Concentration:  Good  ?Memory:  WNL  ?Fund of knowledge:   Good  ?Insight:    Good  ?Judgment:   Good  ?Impulse Control:  Good  ? ?Risk Assessment: ?Danger to Self:  No ?Self-injurious Behavior: No ?Danger to Others: No ?Duty to Warn:no ?Physical Aggression / Violence:No  ?Access to Firearms a concern: No  ?Gang Involvement:No  ? ?Subjective: Patient was present for session. She shared that she still is going through more testing and does not have a definite diagnosis.  She shared that she is continuing to have to have the same test repeated and that levels either look really bad or really good and the doctor is not getting a conclusive diagnosis to what is going on with her.  Patient stated she is very frustrated over the whole situation.  She is recognizing when episodes are starting to come when she gets real fatigued and she is trying to be more mindful and self care but others are struggling with her needing more rest.  Patient stated it is difficult to communicate with her family about what is going on because they get upset and she ends up consoling them.  Patient was encouraged to think through what she needs for support at this time and ways to set limits and discussed that with her family appropriately were discussed with patient.  Patient was  encouraged to continue listening to her body and doing what she needs to do for herself rather than feeling the pressure from family members.  She was also encouraged to allow her fianc?/boyfriend to help them be there to support her.  Stated she realizes that stress causes episodes as well.  Discussed the importance of doing meditation and deep breathing and her grounding exercises to try and keep her stress level down as much is possible. ? ?Interventions: Cognitive Behavioral Therapy and Solution-Oriented/Positive Psychology ? ?Diagnosis: ?  ICD-10-CM   ?1. Generalized anxiety disorder  F41.1   ?  ? ? ?Plan: Patient is to use CBT and coping skills to decrease anxiety symptoms.  Patient is to focus on what she can control fix and change.  Patient is to continue working with medical providers on her medical issues.  Patient is to exercise to release negative emotions appropriately.  Patient is to work on the 21-day detox steps for the neuro cycle by Dr. Tawanna Solo to help make sure her thoughts are moving in a positive direction.  Patient is to take medication as directed. ?Long-term goal: Resolve the core conflict that is the source of anxiety ?Short-term goal: Describe current and past experiences with specific fears prominent worries and anxiety symptoms including their impact on functioning and attempts to resolve it ?  ? ?[name], [facility] ? ? ? ? ? ? ? ? ? ? ? ? ? ? ? ? ? ? ?

## 2021-06-12 ENCOUNTER — Encounter: Payer: Self-pay | Admitting: Psychiatry

## 2021-06-12 DIAGNOSIS — F902 Attention-deficit hyperactivity disorder, combined type: Secondary | ICD-10-CM

## 2021-06-18 MED ORDER — AMPHETAMINE-DEXTROAMPHET ER 30 MG PO CP24: 30.0000 mg | ORAL_CAPSULE | Freq: Every day | ORAL | 0 refills | Status: DC

## 2021-06-24 ENCOUNTER — Ambulatory Visit (INDEPENDENT_AMBULATORY_CARE_PROVIDER_SITE_OTHER): Payer: 59 | Admitting: Psychiatry

## 2021-06-24 DIAGNOSIS — F411 Generalized anxiety disorder: Secondary | ICD-10-CM | POA: Diagnosis not present

## 2021-06-24 NOTE — Progress Notes (Signed)
[facility] Counselor/Therapist Progress Note  Patient ID: Bethany Terry, MRN: [id number],    Date: 06/24/2021  Time Spent: 57 minutes start time 12:07 PM end time 1:04 PM  Treatment Type: Individual Therapy  Reported Symptoms: fatigue, health issues, chronic pain, depression, anxiety  Mental Status Exam:  Appearance:   Well Groomed     Behavior:  Appropriate  Motor:  Normal  Speech/Language:   Normal Rate  Affect:  Appropriate  Mood:  sad  Thought process:  normal  Thought content:    WNL  Sensory/Perceptual disturbances:    WNL  Orientation:  oriented to person, place, time/date, and situation  Attention:  Good  Concentration:  Good  Memory:  WNL  Fund of knowledge:   Good  Insight:    Good  Judgment:   Good  Impulse Control:  Good   Risk Assessment: Danger to Self:  No Self-injurious Behavior: No Danger to Others: No Duty to Warn:no Physical Aggression / Violence:No  Access to Firearms a concern: No  Gang Involvement:No   Subjective: Patient was present for session. She shared she is still having health issues but is not able to do anything to get better at this time. So things are on hold at this time and she is trying to come to terms with her situation.  Patient also shared that she lost her job.  Patient explained what had happened and how frustrated she is about the whole situation since she did not do what she was accused of doing and the job situation.  Patient was encouraged to think through what she is learned and what she can take from what happened to help her in the future and how to use the situation to make better decisions concerning future job options.  Patient was able to identify some things that she felt she had gained from the whole situation as well as different places that she would be interested in working.  The importance of her getting back into school and figuring out what she wants to do for her future were discussed with patient.  Patient  was also encouraged to continue thinking about her future with her fianc and the possibility of him joining the [employer].  Shared that she had gotten through her family time with her mother even though it was very difficult.  She shared that her sister has been texting her a lot and expressing her concerns regarding her mother which has triggered her more.  Ways to keep perspective with the situation were addressed with patient.  Patient was able to recognize that stress seems to send her into episodes physically so she wants to make sure she keeps her stress levels down as much is possible.  Ways to try and help her do that were addressed in session and plans were developed with patient.  Interventions: Cognitive Behavioral Therapy, Solution-Oriented/Positive Psychology, and Insight-Oriented  Diagnosis:   ICD-10-CM   1. Generalized anxiety disorder  F41.1       Plan: Patient is to use CBT and coping skills to decrease anxiety symptoms.  Patient is to focus on what she can control fix and change.  Patient is to continue working with medical providers on her medical issues.  Patient is to exercise to release negative emotions appropriately.  Patient is to work on the 21-day detox steps for the neuro cycle by Dr. De Burrs to help make sure her thoughts are moving in a positive direction.  Patient is to take  medication as directed. Long-term goal: Resolve the core conflict that is the source of anxiety Short-term goal: Describe current and past experiences with specific fears prominent worries and anxiety symptoms including their impact on functioning and attempts to resolve it    [name], [facility]

## 2021-07-02 ENCOUNTER — Ambulatory Visit (INDEPENDENT_AMBULATORY_CARE_PROVIDER_SITE_OTHER): Payer: 59 | Admitting: Psychiatry

## 2021-07-02 ENCOUNTER — Encounter: Payer: Self-pay | Admitting: Psychiatry

## 2021-07-02 DIAGNOSIS — F341 Dysthymic disorder: Secondary | ICD-10-CM

## 2021-07-02 DIAGNOSIS — F902 Attention-deficit hyperactivity disorder, combined type: Secondary | ICD-10-CM | POA: Diagnosis not present

## 2021-07-02 MED ORDER — AMPHETAMINE-DEXTROAMPHET ER 30 MG PO CP24: 30.0000 mg | ORAL_CAPSULE | Freq: Every day | ORAL | 0 refills | Status: DC

## 2021-07-02 MED ORDER — LAMOTRIGINE 200 MG PO TABS: 200.0000 mg | ORAL_TABLET | Freq: Every day | ORAL | 1 refills | Status: DC

## 2021-07-02 NOTE — Progress Notes (Signed)
Bethany Terry [id number] May 24, 2000 21 y.o.  Subjective:   Patient ID:  Bethany Terry is a 21 y.o. (DOB 07-09-2000) female.  Chief Complaint:  Chief Complaint  Patient presents with   Follow-up    Anxiety, depression, ADHD, insomnia    HPI Bethany Terry presents to the office today for follow-up of anxiety, depression, ADHD, and insomnia. Bethany Terry reports that Bethany Terry recently lost her job and this caused significant stress. Bethany Terry has been having health issues and multiple medical appointments. 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 Bethany Terry reports that Bethany Terry did not tolerate one medication and did not want to start a medication that could affect her fertility.   Bethany Terry considered [facility] and declined this since Bethany Terry prefers individual over group treatment. "I feel better now mentally." Bethany Terry reports that Bethany Terry has some current depression- "not motivated, always tired." Bethany Terry reports that her anxiety has been elevated. Bethany Terry started reducing Xanax due to concerns of developing a tolerance "and now I don't feel that way." Bethany Terry reports worry about her health. Occ awakens with some panic symptoms. Bethany Terry has not been able to sleep well recently, even with Xanax. Difficult to quantify sleep due to sleep being fragmented. Bethany Terry reports that her concentration has been ok. Appetite has not been good. Has been craving sweets which is not typical for her. Reports eating healthier recently. Denies SI.   Bethany Terry has been caring for her father who was recently in the hospital. Moved out of her father's home.  Xanax last filled 06/12/21 from 04/09/21 script.   Adderall XR Vyvanse Ritalin Xanax Propranolol Buspar-Adverse reaction Cymbalta Sertraline Celexa Prozac Lexapro Wellbutrin XL- Somewhat helpful Strattera Lamictal Trazodone- Caused hallucinations Clonidine  PHQ2-9    Flowsheet Row Office Visit from 02/17/2017 in Primary Care at [facility] Visit from  11/19/2016 in Primary Care at [facility] Visit from 10/08/2016 in Primary Care at [facility] Visit from 02/21/2015 in Primary Care at [facility] Visit from 01/19/2015 in Primary Care at [facility] Total Score 0 0 0 0 0        Review of Systems:  Review of Systems  Constitutional:  Positive for fever.  Respiratory:  Positive for shortness of breath.   Cardiovascular:  Positive for palpitations.  Gastrointestinal:  Positive for nausea.  Genitourinary:  Positive for flank pain.  Musculoskeletal:  Positive for arthralgias. Negative for gait problem.  Skin:  Positive for rash.  Neurological:  Negative for tremors.  Psychiatric/Behavioral:         Please refer to HPI   Reports that Bethany Terry was prescribed pain medication and did not take this.   Medications: I have reviewed the patient's current medications.  Current Outpatient Medications  Medication Sig Dispense Refill   albuterol (VENTOLIN HFA) 108 (90 Base) MCG/ACT inhaler Inhale 2 puffs into the lungs every 6 (six) hours as needed for wheezing. 1 Inhaler 0   alprazolam (XANAX) 2 MG tablet Take 1 tablet (2 mg total) by mouth daily as needed for anxiety. 30 tablet 2   etonogestrel (NEXPLANON) 68 MG IMPL implant Nexplanon 68 mg subdermal implant  Inject 1 implant by subcutaneous route.     [START ON 08/20/2021] amphetamine-dextroamphetamine (ADDERALL XR) 30 MG 24 hr capsule Take 1 capsule (30 mg total) by mouth daily after breakfast. 30 capsule 0   [START ON 07/23/2021] amphetamine-dextroamphetamine (ADDERALL XR) 30 MG 24 hr capsule Take 1 capsule (30 mg total) by mouth daily after breakfast. 30  capsule 0   [START ON 09/17/2021] amphetamine-dextroamphetamine (ADDERALL XR) 30 MG 24 hr capsule Take 1 capsule (30 mg total) by mouth daily after breakfast. 30 capsule 0   Fluticasone-Salmeterol (ADVAIR) 250-50 MCG/DOSE AEPB Inhale into the lungs. (Patient not taking: Reported on 05/31/2020)     lamoTRIgine (LAMICTAL) 200 MG tablet Take 1  tablet (200 mg total) by mouth daily. 90 tablet 1   No current facility-administered medications for this visit.    Medication Side Effects: None  Allergies:  Allergies  Allergen Reactions   Buspar [Buspirone] Rash   Naproxen Rash    Past Medical History:  Diagnosis Date   Anxiety    Arthritis    Depression    Insomnia    Lupus (HCC)     Past Medical History, Surgical history, Social history, and Family history were reviewed and updated as appropriate.   Please see review of systems for further details on the patient's review from today.   Objective:   Physical Exam:  BP 116/67   Pulse 95   Physical Exam Constitutional:      General: Bethany Terry is not in acute distress. Musculoskeletal:        General: No deformity.  Neurological:     Mental Status: Bethany Terry is alert and oriented to person, place, and time.     Coordination: Coordination normal.  Psychiatric:        Attention and Perception: Attention and perception normal. Bethany Terry does not perceive auditory or visual hallucinations.        Mood and Affect: Mood is anxious and depressed. Affect is not labile, blunt, angry or inappropriate.        Speech: Speech normal.        Behavior: Behavior normal.        Thought Content: Thought content normal. Thought content is not paranoid or delusional. Thought content does not include homicidal or suicidal ideation. Thought content does not include homicidal or suicidal plan.        Cognition and Memory: Cognition and memory normal.        Judgment: Judgment normal.     Comments: Insight intact    Lab Review:     Component Value Date/Time   NA 140 09/08/2019 0006   K 3.6 09/08/2019 0006   CL 103 09/08/2019 0006   CO2 23 09/08/2019 0006   GLUCOSE 121 (H) 09/08/2019 0006   BUN 14 09/08/2019 0006   CREATININE 0.86 09/08/2019 0006   CALCIUM 9.5 09/08/2019 0006   PROT 7.7 10/10/2017 1811   ALBUMIN 4.2 10/10/2017 1811   AST 23 10/10/2017 1811   ALT 18 10/10/2017 1811   ALKPHOS  57 10/10/2017 1811   BILITOT 0.7 10/10/2017 1811   GFRNONAA >60 09/08/2019 0006   GFRAA >60 09/08/2019 0006       Component Value Date/Time   WBC 9.7 09/08/2019 0006   RBC 5.11 09/08/2019 0006   HGB 14.3 09/08/2019 0006   HCT 41.5 09/08/2019 0006   PLT 270 09/08/2019 0006   MCV 81.2 09/08/2019 0006   MCH 28.0 09/08/2019 0006   MCHC 34.5 09/08/2019 0006   RDW 11.7 09/08/2019 0006    No results found for: "POCLITH", "LITHIUM"   No results found for: "PHENYTOIN", "PHENOBARB", "VALPROATE", "CBMZ"   .res Assessment: Plan:   Pt seen for 25 minutes and time spent discussing potential benefits, risks, and side effects of increasing Lamictal to 200 mg po qd to possibly improve mood. Pt agrees to increase in dose.  Also discussed insomnia and possible treatment options. Bethany Terry reports that Bethany Terry is apprehensive about taking a sleep medication and would prefer not to start a medication for insomnia at this time. Discussed potential benefits, risks, and side effects of hydroxyzine and may consider hydroxyzine in the future if insomnia worsens or does not improve.  Continue Adderall XR 30 mg po qd for ADHD.  Continue Xanax 2 mg po qd prn anxiety.  Recommend continuing therapy with [name], [facility].  Pt to follow-up in 3 months or sooner if clinically indicated.  Patient advised to contact office with any questions, adverse effects, or acute worsening in signs and symptoms.   Holy was seen today for follow-up.  Diagnoses and all orders for this visit:  Persistent depressive disorder with atypical features, currently moderate -     lamoTRIgine (LAMICTAL) 200 MG tablet; Take 1 tablet (200 mg total) by mouth daily.  Attention deficit hyperactivity disorder (ADHD), combined type, moderate -     amphetamine-dextroamphetamine (ADDERALL XR) 30 MG 24 hr capsule; Take 1 capsule (30 mg total) by mouth daily after breakfast. -     amphetamine-dextroamphetamine (ADDERALL XR) 30 MG 24 hr capsule;  Take 1 capsule (30 mg total) by mouth daily after breakfast. -     amphetamine-dextroamphetamine (ADDERALL XR) 30 MG 24 hr capsule; Take 1 capsule (30 mg total) by mouth daily after breakfast.     Please see After Visit Summary for patient specific instructions.  Future Appointments  Date Time Provider Department Center  07/15/2021 12:00 PM [name], [facility] CP-CP None  07/29/2021  8:00 AM [name], [facility] CP-CP None  08/12/2021  8:00 AM [name], [facility] CP-CP None  08/21/2021  9:00 AM [name], [facility] CP-CP None  08/27/2021  8:00 AM [name], [facility] CP-CP None  09/03/2021  8:00 AM [name], [facility] CP-CP None  10/02/2021  9:00 AM Corie Chiquito, PM[facility]NP CP-CP None    No orders of the defined types were placed in this encounter.   -------------------------------

## 2021-07-15 ENCOUNTER — Ambulatory Visit (INDEPENDENT_AMBULATORY_CARE_PROVIDER_SITE_OTHER): Payer: 59 | Admitting: Psychiatry

## 2021-07-15 DIAGNOSIS — F411 Generalized anxiety disorder: Secondary | ICD-10-CM

## 2021-07-15 NOTE — Progress Notes (Signed)
      [facility] Counselor/Therapist Progress Note  Patient ID: Bethany Terry, MRN: [id number],    Date: 07/15/2021  Time Spent: 51 minutes start time 12:09 PM end time 1 PM  Treatment Type: Individual Therapy  Reported Symptoms: anxiety, health issues, fatigue, migraines, triggered responses, focusing issues  Mental Status Exam:  Appearance:   Well Groomed     Behavior:  Appropriate  Motor:  Normal  Speech/Language:   Normal Rate  Affect:  Appropriate  Mood:  anxious  Thought process:  normal  Thought content:    WNL  Sensory/Perceptual disturbances:    WNL  Orientation:  oriented to person, place, time/date, and situation  Attention:  Good  Concentration:  Good  Memory:  WNL  Fund of knowledge:   Good  Insight:    Good  Judgment:   Good  Impulse Control:  Good   Risk Assessment: Danger to Self:  No Self-injurious Behavior: No Danger to Others: No Duty to Warn:no Physical Aggression / Violence:No  Access to Firearms a concern: No  Gang Involvement:No   Subjective: Patient was present for session.  She shared she had to start working at her old job again at this time.  She shared she is just letting her health issues be what they are for right now, even though she has really bad flare ups still. She shared she is trying to work with providers but they aren't finding things.  Her dad was in the hospital for kidney stones which was alarming for her and her mother didn't tell her for several hours which was disturbing for her.  Patient shared that she is trying not to let her parents issues get to her too much that is still very frustrating with her dad's hoarding and she feels that he is unable to care for their dog like he needs to.  Reminded patient of the box and those things are not in her box and so she is going to have to let her parents deal with what they need to deal with.  Discussed the importance of focusing on her relationship and moving forward with her self-care  with her health issues at this point.  Patient was able to acknowledge that she needs just to focus on paying attention to her body and doing what she needs to do for herself at this time.    Interventions: Solution-Oriented/Positive Psychology and Insight-Oriented  Diagnosis:   ICD-10-CM   1. Generalized anxiety disorder  F41.1       Plan: Patient is to use CBT and coping skills to decrease anxiety symptoms.  Patient is to focus on what she can control fix and change.  Patient is to continue working with medical providers on her medical issues.  Patient is to exercise to release negative emotions appropriately.  Patient is to work on the 21-day detox steps for the neuro cycle by Dr. De Burrs to help make sure her thoughts are moving in a positive direction.  Patient is to take medication as directed. Long-term goal: Resolve the core conflict that is the source of anxiety Short-term goal: Describe current and past experiences with specific fears prominent worries and anxiety symptoms including their impact on functioning and attempts to resolve it  [name], [facility]

## 2021-07-29 ENCOUNTER — Ambulatory Visit: Payer: 59 | Admitting: Psychiatry

## 2021-08-12 ENCOUNTER — Ambulatory Visit (INDEPENDENT_AMBULATORY_CARE_PROVIDER_SITE_OTHER): Payer: 59 | Admitting: Psychiatry

## 2021-08-12 DIAGNOSIS — F411 Generalized anxiety disorder: Secondary | ICD-10-CM

## 2021-08-12 NOTE — Progress Notes (Signed)
      [facility] Counselor/Therapist Progress Note  Patient ID: Bethany Terry, MRN: [id number],    Date: 08/12/2021  Time Spent: 59 minutes start time 8:05 end 9:04 AM  Treatment Type: Individual Therapy  Reported Symptoms: anxiety, health issues, sadness, fatigue, sleep issues, nightmares, memory issues  Mental Status Exam:  Appearance:   Casual and Neat     Behavior:  Appropriate  Motor:  Normal  Speech/Language:   Normal Rate  Affect:  Appropriate  Mood:  normal  Thought process:  normal  Thought content:    WNL  Sensory/Perceptual disturbances:    WNL  Orientation:  oriented to person, place, time/date, and situation  Attention:  Good  Concentration:  Good  Memory:  Short term memory poor  Fund of knowledge:   Good  Insight:    Good  Judgment:   Good  Impulse Control:  Good   Risk Assessment: Danger to Self:  No Self-injurious Behavior: No Danger to Others: No Duty to Warn:no Physical Aggression / Violence:No  Access to Firearms a concern: No  Gang Involvement:No   Subjective: Patient was present for session. Patient shared she is continuing to have health issues. She shared she has been having nightmares about dying due to her health issues.  She shared she is deciding she wants to get her implant on so that she doesn't have anymore hormones in her body.  She went on to share she and her provider are still trying to figure out what to do.  She went on to share she knows all she wants to eat in sugar and that is not good for her.  Patient did processing set on one of her nightmares which was friendship being gone, suds level 9, negative cognition "I do not get the same that I gave" felt sadness in her chest.  Patient was able to reduce suds level to 6.  She was not able to completely resolve the set.  What got triggered during the processing is the difficulty she is currently having with her boyfriend and how he sometimes does not recognize her needs or no had to be there  for her the way she needs them to be.  Patient was encouraged to allow processing to continue and to journal what surfaces to be addressed at future sessions.  Interventions: Eye Movement Desensitization and Reprocessing (EMDR) and Insight-Oriented  Diagnosis:   ICD-10-CM   1. Generalized anxiety disorder  F41.1       Plan: Patient is to use CBT and coping skills to decrease anxiety symptoms.  Patient is to focus on what she can control fix and change.  Patient is to continue working with medical providers on her medical issues.  Patient is to exercise to release negative emotions appropriately.  Patient is to work on the 21-day detox steps for the neuro cycle by Dr. De Burrs to help make sure her thoughts are moving in a positive direction.  Patient is to journal what processes between sessions.  Patient is to take medication as directed. Long-term goal: Resolve the core conflict that is the source of anxiety Short-term goal: Describe current and past experiences with specific fears prominent worries and anxiety symptoms including their impact on functioning and attempts to resolve it  [name], [facility]

## 2021-08-21 ENCOUNTER — Ambulatory Visit (INDEPENDENT_AMBULATORY_CARE_PROVIDER_SITE_OTHER): Payer: 59 | Admitting: Psychiatry

## 2021-08-21 DIAGNOSIS — F411 Generalized anxiety disorder: Secondary | ICD-10-CM | POA: Diagnosis not present

## 2021-08-21 NOTE — Progress Notes (Signed)
[facility] Counselor/Therapist Progress Note  Patient ID: Bethany Terry, MRN: [id number],    Date: 08/21/2021  Time Spent: 50 minutes start time 9:03 AM end time 9:53 AM  Treatment Type: Individual Therapy  Reported Symptoms: anxiety, fatigue, memory issues, focusing issues, health issues, triggered responses, sleep issues  Mental Status Exam:  Appearance:   Well Groomed     Behavior:  Appropriate  Motor:  Normal  Speech/Language:   Normal Rate  Affect:  Appropriate  Mood:  labile  Thought process:  normal  Thought content:    WNL  Sensory/Perceptual disturbances:    Headache   Orientation:  oriented to person, place, time/date, and situation  Attention:  Good  Concentration:  Good  Memory:  Immediate;   Fair  Fund of knowledge:   Good  Insight:    Good  Judgment:   Good  Impulse Control:  Good   Risk Assessment: Danger to Self:  No Self-injurious Behavior: No Danger to Others: No Duty to Warn:no Physical Aggression / Violence:No  Access to Firearms a concern: No  Gang Involvement:No   Subjective: Patient was present for session. She shared she was not feeling well due to having a headache.  She went on to share that she had to call the police on a client.  Patient stated that the whole incident was very triggering and overwhelming but she has been working hard to get to the other side of it.  Patient shared she did not take any time off of work for fear that she would not want to get back into things.  Patient was encouraged to recognize that she has to continue using her self talk as she deals with difficult situations.  At the same time she was encouraged to take care of herself and to make sure she is doing what is right for her.  Patient explained she is still having lots of medical issues that are concerning to her.  She shared she is having dreams about having a child.  Discussed the possibility of thinking about when she would want to come off of birth control  and see what may happen since she is having lots of anxiety about the possibility of being unable to have a child.  Patient acknowledged she is not quite ready to take that step at this point but is starting to talk with her boyfriend about the option.  She also shared that he potentially may be getting a new job in a few months and they would be moving to [place].  Patient stated she would like to get away from the city since it has lots of triggers for her but she does not want to give up her medical providers and knows she will have to if they move out of the state.  Encouraged patient to take things 1 step at a time and to focus on the things that she can control fix and change.  Interventions: Cognitive Behavioral Therapy and Solution-Oriented/Positive Psychology  Diagnosis:   ICD-10-CM   1. Generalized anxiety disorder  F41.1       Plan:  Patient is to use CBT and coping skills to decrease anxiety symptoms.  Patient is to focus on what she can control fix and change.  Patient is to continue working with medical providers on her medical issues.  Patient is to exercise to release negative emotions appropriately.  Patient is to work on the 21-day detox steps for the neuro cycle by  Dr. De Burrs to help make sure her thoughts are moving in a positive direction.  Patient is to journal what processes between sessions.  Patient is to take medication as directed. Long-term goal: Resolve the core conflict that is the source of anxiety Short-term goal: Describe current and past experiences with specific fears prominent worries and anxiety symptoms including their impact on functioning and attempts to resolve it    [name], [facility]

## 2021-08-27 ENCOUNTER — Ambulatory Visit: Payer: 59 | Admitting: Psychiatry

## 2021-09-02 ENCOUNTER — Emergency Department (HOSPITAL_COMMUNITY): Payer: Managed Care, Other (non HMO)

## 2021-09-02 ENCOUNTER — Encounter (HOSPITAL_COMMUNITY): Payer: Self-pay | Admitting: Emergency Medicine

## 2021-09-02 ENCOUNTER — Emergency Department (HOSPITAL_COMMUNITY)
Admission: EM | Admit: 2021-09-02 | Discharge: 2021-09-03 | Discharge status: Against Medical Advice | Payer: Managed Care, Other (non HMO) | Attending: Emergency Medicine | Admitting: Emergency Medicine

## 2021-09-02 DIAGNOSIS — R42 Dizziness and giddiness: Secondary | ICD-10-CM | POA: Diagnosis not present

## 2021-09-02 DIAGNOSIS — R079 Chest pain, unspecified: Secondary | ICD-10-CM | POA: Insufficient documentation

## 2021-09-02 DIAGNOSIS — Z5321 Procedure and treatment not carried out due to patient leaving prior to being seen by health care provider: Secondary | ICD-10-CM | POA: Diagnosis not present

## 2021-09-02 LAB — BASIC METABOLIC PANEL
Anion gap: 9 (ref 5–15)
BUN: 16 mg/dL (ref 6–20)
CO2: 25 mmol/L (ref 22–32)
Calcium: 9.7 mg/dL (ref 8.9–10.3)
Chloride: 105 mmol/L (ref 98–111)
Creatinine, Ser: 0.8 mg/dL (ref 0.44–1.00)
GFR, Estimated: >60 mL/min (ref >60)
Glucose, Bld: 91 mg/dL (ref 70–99)
Potassium: 3.7 mmol/L (ref 3.5–5.1)
Sodium: 139 mmol/L (ref 135–145)

## 2021-09-02 LAB — I-STAT BETA HCG BLOOD, ED (MC, WL, AP ONLY): I-stat hCG, quantitative: <5 m[IU]/mL (ref <5)

## 2021-09-02 LAB — CBC
HCT: 44.7 % (ref 36.0–46.0)
Hemoglobin: 14.9 g/dL (ref 12.0–15.0)
MCH: 27.5 pg (ref 26.0–34.0)
MCHC: 33.3 g/dL (ref 30.0–36.0)
MCV: 82.5 fL (ref 80.0–100.0)
Platelets: 271 10*3/uL (ref 150–400)
RBC: 5.42 MIL/uL — ABNORMAL HIGH (ref 3.87–5.11)
RDW: 11.5 % (ref 11.5–15.5)
WBC: 11 10*3/uL — ABNORMAL HIGH (ref 4.0–10.5)
nRBC: 0 % (ref 0.0–0.2)

## 2021-09-02 LAB — TROPONIN I (HIGH SENSITIVITY): Troponin I (High Sensitivity): <2 ng/L (ref <18)

## 2021-09-02 NOTE — ED Triage Notes (Signed)
Pt reports chest pain that started a few hours ago. States that she was just driving and not doing anything that she thinks would have caused it. Reports dizziness. Denies SOB.

## 2021-09-03 ENCOUNTER — Ambulatory Visit (INDEPENDENT_AMBULATORY_CARE_PROVIDER_SITE_OTHER): Payer: 59 | Admitting: Psychiatry

## 2021-09-03 DIAGNOSIS — F411 Generalized anxiety disorder: Secondary | ICD-10-CM | POA: Diagnosis not present

## 2021-09-03 NOTE — Progress Notes (Signed)
[facility] Counselor/Therapist Progress Note  Patient ID: Eavan Gonterman, MRN: [id number],    Date: 09/03/2021  Time Spent: 50 minutes start time 8:10 AM end time 9 AM  Treatment Type: Individual Therapy  Reported Symptoms: anxiety, pain, sadness,fatigue, sleep issues, panic  Mental Status Exam:  Appearance:   Well Groomed     Behavior:  Appropriate  Motor:  Normal  Speech/Language:   Normal Rate  Affect:  Appropriate  Mood:  anxious and sad  Thought process:  normal  Thought content:    WNL  Sensory/Perceptual disturbances:    Pain  Orientation:  oriented to person, place, time/date, and situation  Attention:  Good  Concentration:  Good  Memory:  WNL  Fund of knowledge:   Good  Insight:    Good  Judgment:   Good  Impulse Control:  Good   Risk Assessment: Danger to Self:  No Self-injurious Behavior: No Danger to Others: No Duty to Warn:no Physical Aggression / Violence:No  Access to Firearms a concern: No  Gang Involvement:No   Subjective: Patient was present for session. She shared that she felt she was having another flare and it is overwhelming for her.  Patient explained she had been in the emergency room last night but never got to see a doctor even though test from on and so she is not sure what the outcome was.  She shared after 7 hours she just left due to being too exhausted to continue the weight.  Patient stated her pain is still at a 5 and she is not eating because she is afraid it will increase it.  Discussed the importance of eating and going back to the emergency room if things are to get worse.  Patient was reminded of the importance of her self-care and continuing to try and pursue answers for the issues she is having physically.  Patient did share that the blood work came back that there were elevated red blood cells and white blood cells leading to the thought there is something going on with her heart liver or kidneys.  Patient stated she feels her  heart is okay but something is going on with her liver and kidneys.  Again encouraged her to take time for herself and go back to the emergency room and figure out what is going on.  By the end of session patient did agree that she would not be trying to drive and go to work and would go to the emergency room if the pain did not go away and if it did go away she would follow up with primary care physician.  Shared she has been having panic attacks when she is driving.  Discussed different strategies to help her.  Patient was able to identify different situations with her work that have made her feel very vulnerable and after the last attack at work that made things worse and has brought up memories from the past.  Encouraged patient to consider changing jobs.  Also discussed the importance of keeping the brain engaged in different exercises while she is driving.  Patient agreed to listen to podcast by Dr. De Burrs.  Patient also agreed to doing some grounding exercises to try and help keep herself as cognitive as possible so that she is safe when she is driving.  Interventions: Solution-Oriented/Positive Psychology and Insight-Oriented  Diagnosis:   ICD-10-CM   1. Generalized anxiety disorder  F41.1       Plan:  Patient is to  use CBT and coping skills to decrease anxiety symptoms.  Patient is to go back to the emergency room or see her primary care physician sometime during the day.  Patient is to focus on what she can control fix and change.  Patient is to continue working with medical providers on her medical issues.  Patient is to exercise to release negative emotions appropriately.  Patient is to work on the 21-day detox steps for the neuro cycle by Dr. De Burrs to help make sure her thoughts are moving in a positive direction.  Patient is to journal what processes between sessions.  Patient is to take medication as directed. Long-term goal: Resolve the core conflict that is the source of  anxiety Short-term goal: Describe current and past experiences with specific fears prominent worries and anxiety symptoms including their impact on functioning and attempts to resolve it    [name], [facility]

## 2021-09-16 ENCOUNTER — Other Ambulatory Visit: Payer: Self-pay

## 2021-09-16 ENCOUNTER — Encounter (HOSPITAL_COMMUNITY): Payer: Self-pay | Admitting: Emergency Medicine

## 2021-09-16 ENCOUNTER — Emergency Department (HOSPITAL_COMMUNITY)
Admission: EM | Admit: 2021-09-16 | Discharge: 2021-09-17 | Disposition: A | Discharge status: Home / Self Care | Payer: Managed Care, Other (non HMO) | Attending: Emergency Medicine | Admitting: Emergency Medicine

## 2021-09-16 DIAGNOSIS — H60503 Unspecified acute noninfective otitis externa, bilateral: Secondary | ICD-10-CM | POA: Diagnosis not present

## 2021-09-16 DIAGNOSIS — H9209 Otalgia, unspecified ear: Secondary | ICD-10-CM | POA: Diagnosis present

## 2021-09-16 DIAGNOSIS — R000 Tachycardia, unspecified: Secondary | ICD-10-CM | POA: Insufficient documentation

## 2021-09-16 DIAGNOSIS — H6693 Otitis media, unspecified, bilateral: Secondary | ICD-10-CM | POA: Insufficient documentation

## 2021-09-16 DIAGNOSIS — H6690 Otitis media, unspecified, unspecified ear: Secondary | ICD-10-CM

## 2021-09-16 NOTE — ED Triage Notes (Signed)
Patient reports left ear ache for 4 days with mild drainage  , denies injury , no hearing loss.

## 2021-09-17 ENCOUNTER — Ambulatory Visit: Payer: Self-pay

## 2021-09-17 ENCOUNTER — Ambulatory Visit
Admission: EM | Admit: 2021-09-17 | Discharge: 2021-09-17 | Disposition: A | Discharge status: Home / Self Care | Payer: Managed Care, Other (non HMO) | Attending: Emergency Medicine | Admitting: Emergency Medicine

## 2021-09-17 ENCOUNTER — Emergency Department (HOSPITAL_COMMUNITY): Payer: Managed Care, Other (non HMO)

## 2021-09-17 DIAGNOSIS — H6693 Otitis media, unspecified, bilateral: Secondary | ICD-10-CM

## 2021-09-17 DIAGNOSIS — H9203 Otalgia, bilateral: Secondary | ICD-10-CM

## 2021-09-17 DIAGNOSIS — H6093 Unspecified otitis externa, bilateral: Secondary | ICD-10-CM

## 2021-09-17 LAB — CBC WITH DIFFERENTIAL/PLATELET
Abs Immature Granulocytes: 0.07 10*3/uL (ref 0.00–0.07)
Basophils Absolute: 0.1 10*3/uL (ref 0.0–0.1)
Basophils Relative: 0 %
Eosinophils Absolute: 0 10*3/uL (ref 0.0–0.5)
Eosinophils Relative: 0 %
HCT: 41.7 % (ref 36.0–46.0)
Hemoglobin: 14.6 g/dL (ref 12.0–15.0)
Immature Granulocytes: 1 %
Lymphocytes Relative: 15 %
Lymphs Abs: 2.2 10*3/uL (ref 0.7–4.0)
MCH: 28.9 pg (ref 26.0–34.0)
MCHC: 35 g/dL (ref 30.0–36.0)
MCV: 82.6 fL (ref 80.0–100.0)
Monocytes Absolute: 0.9 10*3/uL (ref 0.1–1.0)
Monocytes Relative: 6 %
Neutro Abs: 11.1 10*3/uL — ABNORMAL HIGH (ref 1.7–7.7)
Neutrophils Relative %: 78 %
Platelets: 229 10*3/uL (ref 150–400)
RBC: 5.05 MIL/uL (ref 3.87–5.11)
RDW: 11.7 % (ref 11.5–15.5)
WBC: 14.5 10*3/uL — ABNORMAL HIGH (ref 4.0–10.5)
nRBC: 0 % (ref 0.0–0.2)

## 2021-09-17 LAB — I-STAT CHEM 8, ED
BUN: 9 mg/dL (ref 6–20)
Calcium, Ion: 1.14 mmol/L — ABNORMAL LOW (ref 1.15–1.40)
Chloride: 103 mmol/L (ref 98–111)
Creatinine, Ser: 0.6 mg/dL (ref 0.44–1.00)
Glucose, Bld: 108 mg/dL — ABNORMAL HIGH (ref 70–99)
HCT: 42 % (ref 36.0–46.0)
Hemoglobin: 14.3 g/dL (ref 12.0–15.0)
Potassium: 4.1 mmol/L (ref 3.5–5.1)
Sodium: 139 mmol/L (ref 135–145)
TCO2: 23 mmol/L (ref 22–32)

## 2021-09-17 LAB — I-STAT BETA HCG BLOOD, ED (MC, WL, AP ONLY): I-stat hCG, quantitative: <5 m[IU]/mL (ref <5)

## 2021-09-17 MED ORDER — AMOXICILLIN-POT CLAVULANATE 875-125 MG PO TABS
1.0000 | ORAL_TABLET | Freq: Once | ORAL | Status: AC
  Administered 2021-09-17: 1 via ORAL
  Filled 2021-09-17: qty 1

## 2021-09-17 MED ORDER — CIPROFLOXACIN-DEXAMETHASONE 0.3-0.1 % OT SUSP
4.0000 [drp] | Freq: Once | OTIC | Status: AC
  Administered 2021-09-17: 4 [drp] via OTIC
  Filled 2021-09-17: qty 7.5

## 2021-09-17 MED ORDER — CEFTRIAXONE SODIUM 1 G IJ SOLR
1.0000 g | Freq: Once | INTRAMUSCULAR | Status: AC
  Administered 2021-09-17: 1 g via INTRAMUSCULAR

## 2021-09-17 MED ORDER — HYDROCODONE-ACETAMINOPHEN 5-325 MG PO TABS
1.0000 | ORAL_TABLET | Freq: Once | ORAL | Status: AC
  Administered 2021-09-17: 1 via ORAL
  Filled 2021-09-17: qty 1

## 2021-09-17 MED ORDER — ACETAMINOPHEN 325 MG PO TABS
650.0000 mg | ORAL_TABLET | Freq: Once | ORAL | Status: AC
  Administered 2021-09-17: 650 mg via ORAL
  Filled 2021-09-17: qty 2

## 2021-09-17 MED ORDER — LACTATED RINGERS IV BOLUS
1000.0000 mL | Freq: Once | INTRAVENOUS | Status: AC
  Administered 2021-09-17: 1000 mL via INTRAVENOUS

## 2021-09-17 MED ORDER — AMOXICILLIN-POT CLAVULANATE 875-125 MG PO TABS: 1.0000 | ORAL_TABLET | Freq: Two times a day (BID) | ORAL | 0 refills | Status: DC

## 2021-09-17 NOTE — ED Provider Notes (Signed)
[facility] Emergency Department Provider Note MRN:  [id number]  Arrival date & time: 09/17/21     Chief Complaint   Otalgia (Tachycardic)   History of Present Illness   Bethany Terry is a 21 y.o. year-old female presents to the ED with chief complaint of ear pain and discharge.  Has been having symptoms for the past 4 days.  [facility] and otitis externa.  Patient isn't diabetic.  She reports fever to 104 earlier today.  Has been taking amox and acetic acid drops.  History provided by patient.   Review of Systems  Pertinent positive and negative review of systems noted in HPI.    Physical Exam   Vitals:   09/17/21 0102 09/17/21 0339  BP:  124/72  Pulse: (!) 124 (!) 114  Resp:  20  Temp:  99.5 F (37.5 C)  SpO2: 100% 100%    CONSTITUTIONAL:  non-toxic-appearing, NAD NEURO:  Alert and oriented x 3, CN 3-12 grossly intact EYES:  eyes equal and reactive ENT/NECK:  Supple, no stridor, left TM is not visualized due to edematous ear canal, there is mild discharge and debris, right TM erythematous, debris and mild edema of ear canal CARDIO:  tachycardic, regular rhythm, appears well-perfused  PULM:  No respiratory distress,  GI/GU:  non-distended,  MSK/SPINE:  No gross deformities, no edema, moves all extremities  SKIN:  no rash, atraumatic   *Additional and/or pertinent findings included in MDM below  Diagnostic and Interventional Summary    Labs Reviewed  CBC WITH DIFFERENTIAL/PLATELET - Abnormal; Notable for the following components:      Result Value   WBC 14.5 (*)    Neutro Abs 11.1 (*)    All other components within normal limits  I-STAT CHEM 8, ED - Abnormal; Notable for the following components:   Glucose, Bld 108 (*)    Calcium, Ion 1.14 (*)    All other components within normal limits  I-STAT BETA HCG BLOOD, ED ([facility], [facility], [facility] ONLY)    CT HEAD WO CONTRAST ( )  Final Result    CT TEMPORAL  BONES WO CONTRAST  Final Result      Medications  ciprofloxacin-dexamethasone (CIPRODEX) 0.3-0.1 % OTIC (EAR) suspension 4 drop (has no administration in time range)  amoxicillin-clavulanate (AUGMENTIN) 875-125 MG per tablet 1 tablet (has no administration in time range)  HYDROcodone-acetaminophen (NORCO/VICODIN) 5-325 MG per tablet 1 tablet (1 tablet Oral Given 09/17/21 0258)  acetaminophen (TYLENOL) tablet 650 mg (650 mg Oral Given 09/17/21 0258)  lactated ringers bolus 1,000 mL (1,000 mLs Intravenous New Bag/Given 09/17/21 0258)     Procedures  /  Critical Care Procedures  ED Course and Medical Decision Making  I have reviewed the triage vital signs, the nursing notes, and pertinent available records from the EMR.  Social Determinants Affecting Complexity of Care: Patient has no clinically significant social determinants affecting this chief complaint..   ED Course:    Medical Decision Making Patient here with ear pain.  Exam consistent with otitis externa and presumed otitis media. She does have some mastoid tenderness, due to fever and tachycardia will give fluids, check labs, and check CT temporal bones.  CTs are reassuring.  Will treat with ciprodex drops, ear wicks placed, will give Augmentin.  Amount and/or Complexity of Data Reviewed Labs: ordered.    Details: Leukocytosis noted Radiology: ordered.    Details: Reassuring CTs, no abscess or mastoiditis  Risk OTC drugs. Prescription drug management.  Consultants: No consultations were needed in caring for this patient.   Treatment and Plan: Emergency department workup does not suggest an emergent condition requiring admission or immediate intervention beyond  what has been performed at this time. The patient is safe for discharge and has  been instructed to return immediately for worsening symptoms, change in  symptoms or any other concerns    Final Clinical Impressions(s) / ED Diagnoses     ICD-10-CM    1. Acute otitis externa of both ears, unspecified type  H60.503     2. Acute otitis media, unspecified otitis media type  H66.90       ED Discharge Orders          Ordered    amoxicillin-clavulanate (AUGMENTIN) 875-125 MG tablet  Every 12 hours        09/17/21 0408              Discharge Instructions Discussed with and Provided to Patient:    Discharge Instructions      Please take antibiotics as directed.    DISCONTINUE your current ear drops and antibiotics.  Instill 4 drops in each ear morning and evening for the next 7 days.      Roxy Horseman, PA-C 09/17/21 0416    Marily Memos, MD 09/17/21 [phone]

## 2021-09-17 NOTE — ED Notes (Signed)
Ice pack given per pt request. 

## 2021-09-17 NOTE — Discharge Instructions (Addendum)
The pain in your ears is being caused by the infection that you have been diagnosed with.  Your pain will not resolve until you treat the infection.    You were provided with an injection of Rocephin during your visit today.  Please go immediately to the pharmacy to pick up your scription for Augmentin.  Please take all doses as prescribed.

## 2021-09-17 NOTE — Telephone Encounter (Signed)
  Chief Complaint: bilateral ear pain, muffled hearing left > right Symptoms: severe ear pain and headache Frequency: last Thursday Pertinent Negatives: Patient denies stiff neck, dizziness, vomiting, runny nose Disposition: [] ED /[x] Urgent Care (no appt availability in office) / [] Appointment(In office/virtual)/ []  [facility]/ [] Home Care/ [] Refused Recommended Disposition /[] [facility]/ []  Follow-up with PCP Additional Notes: Pt stated she has no transportation- advised pt there is a [facility]- and she stated it is right across the road from her apartment and she will get someone to drive her. Pt cried throughout conversation. Emotional support provided. Pt was seen in ED yesterday but pt has worsened since that time. Reason for Disposition  Patient sounds very sick or weak to the triager    Crying in pain  Answer Assessment - Initial Assessment Questions 1. LOCATION: "Which ear is involved?"     Both left greater than right 2. ONSET: "When did the ear start hurting"      Thursday at ED yesterday 3. SEVERITY: "How bad is the pain?"  (Scale 1-10; mild, moderate or severe)   - MILD (1-3): doesn't interfere with normal activities    - MODERATE (4-7): interferes with normal activities or awakens from sleep    - SEVERE (8-10): excruciating pain, unable to do any normal activities      severe 4. URI SYMPTOMS: "Do you have a runny nose or cough?"     no 5. FEVER: "Do you have a fever?" If Yes, ask: "What is your temperature, how was it measured, and when did it start?"     yes 6. CAUSE: "Have you been swimming recently?", "How often do you use Q-TIPS?", "Have you had any recent air travel or scuba diving?"     no 7. OTHER SYMPTOMS: "Do you have any other symptoms?" (e.g., headache, stiff neck, dizziness, vomiting, runny nose, decreased hearing)     Headache, neck stiff  8. PREGNANCY: "Is there any chance you are pregnant?" "When was your last menstrual period?"      N/a  Protocols used: 

## 2021-09-17 NOTE — ED Provider Notes (Signed)
[facility]    CSN: [id number] Arrival date & time: 09/17/21  1516    HISTORY   Chief Complaint  Patient presents with   Otalgia   HPI Bethany Terry is a pleasant, 21 y.o. female who presents to urgent care today. Patient presents to urgent care approximately 12 hours after being discharged from the emergency room where she was diagnosed with bilateral otitis media and bilateral otitis externa.  Patient was provided with a prescription for Augmentin which she states she has not picked up yet.  Patient states the ED provider told her that it would not be ready until the afternoon.  Unclear why patient still has not picked up her prescription.  Patient states she took Tylenol an hour ago without relief of her pain.  Patient primarily is complaining about pain at this time and is requesting pain relief.  Patient states she does not want narcotic pain medication, states she was given narcotic pain medication in the emergency room without relief of her pain.  Patient reports an intolerance of ibuprofen due to a history of GERD and gastric ulcers.  Per EMR, patient also reports an allergy to naproxen stating it causes rash.  The history is provided by the patient.   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 No  pertinent surgical history. OB History   No obstetric history on file.    Home Medications    Prior to Admission medications   Medication Sig Start Date End Date Taking? Authorizing Provider  albuterol (VENTOLIN HFA) 108 (90 Base) MCG/ACT inhaler Inhale 2 puffs into the lungs every 6 (six) hours as needed for wheezing. 10/10/17   Gerhard Munch, MD  alprazolam Prudy Feeler) 2 MG tablet Take 1 tablet (2 mg total) by mouth daily as needed for anxiety. 04/28/21   Corie Chiquito, PMHNP  amoxicillin-clavulanate (AUGMENTIN) 875-125 MG tablet Take 1 tablet by mouth every 12 (twelve) hours. 09/17/21   Roxy Horseman, PA-C  amphetamine-dextroamphetamine (ADDERALL XR) 30 MG 24 hr capsule Take 1 capsule (30 mg total) by mouth daily after breakfast. 08/20/21 09/19/21  Corie Chiquito, PMHNP  amphetamine-dextroamphetamine (ADDERALL XR) 30 MG 24 hr capsule Take 1 capsule (30 mg total) by mouth daily after breakfast. 07/23/21 08/22/21  Corie Chiquito, PMHNP  amphetamine-dextroamphetamine (ADDERALL XR) 30 MG 24 hr capsule Take 1 capsule (30 mg total) by mouth daily after breakfast. 09/17/21 10/17/21  Corie Chiquito, PMHNP  etonogestrel (NEXPLANON) 68 MG IMPL implant Nexplanon 68 mg subdermal implant  Inject 1 implant by subcutaneous route.    [provider]  Fluticasone-Salmeterol (ADVAIR) 250-50 MCG/DOSE AEPB Inhale into the lungs. Patient not taking: Reported on 05/31/2020 09/14/19   [provider]  lamoTRIgine (LAMICTAL) 200 MG tablet Take 1 tablet (200 mg total) by mouth daily. 07/02/21 09/30/21  Corie Chiquito, PMHNP    Family History Family History  Adopted: Yes   Social History Social History   Tobacco Use   Smoking status: Never   Smokeless tobacco: Never  Vaping Use   Vaping Use: Never used  Substance Use Topics   Alcohol use: No   Drug use: No   Allergies   Buspar [buspirone], Ibuprofen, and Naproxen  Review of Systems Review of Systems Pertinent findings revealed after  performing a 14 point review of systems has been noted in the history of present illness.  Physical Exam Triage Vital Signs ED Triage Vitals  Enc Vitals Group     BP 11/02/20 0827 (!) 147/82     Pulse Rate 11/02/20 0827 72     Resp 11/02/20 0827 18     Temp 11/02/20 0827 98.3 F (36.8 C)     Temp Source 11/02/20 0827 Oral     SpO2 11/02/20 0827 98 %     Weight --      Height --      Head Circumference --      Peak Flow --      Pain Score 11/02/20 0826 5     Pain Loc --      Pain Edu? --      Excl. in GC? --   No data found.  Updated Vital Signs BP 120/78 (BP Location: Left Arm)   Pulse (!) 114   Temp 100 F (37.8 C) (Oral)   Resp 18   SpO2 99%   Physical Exam HENT:     Head: Normocephalic and atraumatic.     Jaw: There is normal jaw occlusion.     Ears:     Comments: Patient reports decreased hearing in both ears.  Bilateral EACs are narrowed secondary to edema, purulent material present in both ears, ear wicks in place.    Nose: Nose normal.     Mouth/Throat:     Lips: Pink.     Mouth: Mucous membranes are moist.     Pharynx: Oropharynx is clear.  Musculoskeletal:     Cervical back: Full passive range of motion without pain, normal range of motion and neck supple.  Lymphadenopathy:     Cervical: Cervical adenopathy present.     Right cervical: Posterior cervical adenopathy present.     Left cervical: Posterior cervical adenopathy present.  Psychiatric:        Attention and Perception: Attention and perception normal.        Mood and Affect: Mood is anxious. Affect is labile and tearful.        Speech: Speech normal.        Behavior: Behavior is agitated. Behavior is cooperative.        Thought Content: Thought content normal.        Cognition and Memory: Cognition and memory normal.        Judgment: Judgment is inappropriate.     Visual Acuity Right Eye Distance:   Left Eye Distance:   Bilateral Distance:    Right Eye Near:   Left Eye Near:     Bilateral Near:     [facility] Couse / Diagnostics / Procedures:     Radiology CT TEMPORAL BONES WO CONTRAST  Result Date: 09/17/2021 CLINICAL DATA:  Mastoiditis.  Left ear pain. EXAM: CT TEMPORAL BONES WITHOUT CONTRAST TECHNIQUE: Axial and coronal plane CT imaging of the petrous temporal bones was performed with thin-collimation image reconstruction. No intravenous contrast was administered. Multiplanar CT image reconstructions were also generated. RADIATION DOSE REDUCTION: This exam was performed according  to the departmental dose-optimization program which includes automated exposure control, adjustment of the mA and/or kV according to patient size and/or use of iterative reconstruction technique. COMPARISON:  None Available. FINDINGS: RIGHT TEMPORAL BONE External auditory canal: Normal Middle ear cavity: Normally aerated. The scutum and ossicles are normal. The tegmen tympani is intact. Inner ear structures: The cochlea, vestibule and semicircular canals are normal. The vestibular aqueduct is not enlarged. Internal auditory and facial nerve canals:  Normal Mastoid air cells: Normally aerated. No osseous erosion. LEFT TEMPORAL BONE External auditory canal: Diffuse soft tissue thickening Middle ear cavity: Near complete opacification. Scutum remains sharp. Tegmen tympani is intact. Normal ossicles. Inner ear structures: The cochlea, vestibule and semicircular canals are normal. The vestibular aqueduct is not enlarged. Internal auditory and facial nerve canals:  Normal. Mastoid air cells: There are a few opacified mastoid air cells but no large mastoid effusion or coalescence. Vascular: Normal non-contrast appearance of the carotid canals, jugular bulbs and sigmoid plates. Limited intracranial:  No acute or significant finding. Visible orbits/paranasal sinuses: No acute or significant finding. Soft tissues: Normal. IMPRESSION: 1. Left external auditory canal and middle ear effusion, consistent with otitis externa  and otitis media. 2. No coalescent mastoiditis. 3. Normal right temporal bone. Electronically Signed   By: Deatra Robinson M.D.   On: 09/17/2021 03:45   CT HEAD WO CONTRAST ( )  Result Date: 09/17/2021 CLINICAL DATA:  Headache, new or worsening, neuro deficit. EXAM: CT HEAD WITHOUT CONTRAST TECHNIQUE: Contiguous axial images were obtained from the base of the skull through the vertex without intravenous contrast. RADIATION DOSE REDUCTION: This exam was performed according to the departmental dose-optimization program which includes automated exposure control, adjustment of the mA and/or kV according to patient size and/or use of iterative reconstruction technique. COMPARISON:  None Available. FINDINGS: Brain: No acute intracranial hemorrhage, midline shift or mass effect. No extra-axial fluid collection. Gray-white matter differentiation is within normal limits. No hydrocephalus. Vascular: No hyperdense vessel or unexpected calcification. Skull: Normal. Negative for fracture or focal lesion. Sinuses/Orbits: No acute finding. Other: None. IMPRESSION: No acute intracranial process. Electronically Signed   By: Thornell Sartorius M.D.   On: 09/17/2021 03:32    Procedures Procedures (including critical care time) EKG  Pending results:  Labs Reviewed - No data to display  Medications Ordered in UC: Medications  cefTRIAXone (ROCEPHIN) injection 1 g (1 g Intramuscular Given 09/17/21 1602)    UC Diagnoses / Final Clinical Impressions(s)   I have reviewed the triage vital signs and the nursing notes.  Pertinent labs & imaging results that were available during my care of the patient were reviewed by me and considered in my medical decision making (see chart for details).    Final diagnoses:  Otitis externa of both ears, unspecified chronicity, unspecified type  Bilateral otitis media, unspecified otitis media type  Acute ear pain, bilateral   Patient provided with an injection of ceftriaxone in attempt  to rapidly relief some of the infection in her ears.  Patient advised to go now to pick up her prescription for Augmentin and to take it exactly as prescribed.  Patient advised that she can continue Tylenol as needed for pain since she is unable to take any other NSAIDs.  ED Prescriptions   None    PDMP not reviewed this encounter.  Disposition Upon Discharge:  Condition: stable for discharge home Home: take medications as prescribed; routine discharge instructions as discussed; follow up as advised.  Patient presented with an acute illness  with associated systemic symptoms and significant discomfort requiring urgent management. In my opinion, this is a condition that a prudent lay person (someone who possesses an average knowledge of health and medicine) may potentially expect to result in complications if not addressed urgently such as respiratory distress, impairment of bodily function or dysfunction of bodily organs.   Routine symptom specific, illness specific and/or disease specific instructions were discussed with the patient and/or caregiver at length.   As such, the patient has been evaluated and assessed, work-up was performed and treatment was provided in alignment with urgent care protocols and evidence based medicine.  Patient/parent/caregiver has been advised that the patient may require follow up for further testing and treatment if the symptoms continue in spite of treatment, as clinically indicated and appropriate.  If the patient was tested for COVID-19, Influenza and/or RSV, then the patient/parent/guardian was advised to isolate at home pending the results of his/her diagnostic coronavirus test and potentially longer if they're positive. I have also advised pt that if his/her COVID-19 test returns positive, it's recommended to self-isolate for at least 10 days after symptoms first appeared AND until fever-free for 24 hours without fever reducer AND other symptoms have improved  or resolved. Discussed self-isolation recommendations as well as instructions for household member/close contacts as per the [facility] [place] [facility], and also gave patient the COVID packet with this information.  Patient/parent/caregiver has been advised to return to the [facility] or PCP in 3-5 days if no better; to PCP or the Emergency Department if new signs and symptoms develop, or if the current signs or symptoms continue to change or worsen for further workup, evaluation and treatment as clinically indicated and appropriate  The patient will follow up with their current PCP if and as advised. If the patient does not currently have a PCP we will assist them in obtaining one.   The patient may need specialty follow up if the symptoms continue, in spite of conservative treatment and management, for further workup, evaluation, consultation and treatment as clinically indicated and appropriate.  Patient/parent/caregiver verbalized understanding and agreement of plan as discussed.  All questions were addressed during visit.  Please see discharge instructions below for further details of plan.  Discharge Instructions:   Discharge Instructions      The pain in your ears is being caused by the infection that you have been diagnosed with.  Your pain will not resolve until you treat the infection.    You were provided with an injection of Rocephin during your visit today.  Please go immediately to the pharmacy to pick up your scription for Augmentin.  Please take all doses as prescribed.      This office note has been dictated using Teaching laboratory technician.  Unfortunately, this method of dictation can sometimes lead to typographical or grammatical errors.  I apologize for your inconvenience in advance if this occurs.  Please do not hesitate to reach out to me if clarification is needed.      Theadora Rama Scales, PA-C 09/17/21 [phone]

## 2021-09-17 NOTE — Discharge Instructions (Signed)
Please take antibiotics as directed.    DISCONTINUE your current ear drops and antibiotics.  Instill 4 drops in each ear morning and evening for the next 7 days.

## 2021-09-17 NOTE — ED Triage Notes (Signed)
Pt c/o bilateral ear and neck pain since Sunday night. Was seen and tx'd in the ED last night, has not started her antibiotics yet. States had tylenol an hour ago with no relief.

## 2021-10-02 ENCOUNTER — Encounter: Payer: Self-pay | Admitting: Psychiatry

## 2021-10-02 ENCOUNTER — Ambulatory Visit (INDEPENDENT_AMBULATORY_CARE_PROVIDER_SITE_OTHER): Payer: 59 | Admitting: Psychiatry

## 2021-10-02 DIAGNOSIS — F341 Dysthymic disorder: Secondary | ICD-10-CM

## 2021-10-02 DIAGNOSIS — F410 Panic disorder [episodic paroxysmal anxiety] without agoraphobia: Secondary | ICD-10-CM | POA: Diagnosis not present

## 2021-10-02 DIAGNOSIS — F902 Attention-deficit hyperactivity disorder, combined type: Secondary | ICD-10-CM

## 2021-10-02 DIAGNOSIS — F411 Generalized anxiety disorder: Secondary | ICD-10-CM

## 2021-10-02 DIAGNOSIS — F5105 Insomnia due to other mental disorder: Secondary | ICD-10-CM | POA: Diagnosis not present

## 2021-10-02 MED ORDER — AMPHETAMINE-DEXTROAMPHET ER 30 MG PO CP24: 30.0000 mg | ORAL_CAPSULE | Freq: Every day | ORAL | 0 refills | Status: DC

## 2021-10-02 MED ORDER — ALPRAZOLAM 2 MG PO TABS: 2.0000 mg | ORAL_TABLET | Freq: Every day | ORAL | 2 refills | Status: DC | PRN

## 2021-10-02 MED ORDER — LAMOTRIGINE 100 MG PO TABS: 200.0000 mg | ORAL_TABLET | Freq: Every day | ORAL | 0 refills | Status: DC

## 2021-10-02 NOTE — Progress Notes (Signed)
Bethany Terry [id number] May 18, 2000 21 y.o.  Subjective:   Patient ID:  Bethany Terry is a 21 y.o. (DOB 11-02-2000) female.  Chief Complaint:  Chief Complaint  Patient presents with   Anxiety   Follow-up    Depression, ADHD, and insomnia    HPI Bethany Terry presents to the office today for follow-up of anxiety, depression, insomnia, and ADHD. She is unsure of response to increase in Lamictal. She reports that she felt better with Lamictal two 100 mg tabs instead of one 200 mg tab.  She continues to experience multiple health issues and pain. Had recent severe ear infection and required IV antibiotics. She reports that she is just starting to notice some improvement.   She has had some anxiety and panic in response to increased stressors. Has had panic with driving and attributes this to h/o severe physical symptoms while driving on one occasion in the past. "Other than that, I feel mostly fine now." She reports some mild depression. Concentration has been "pretty decent" after taking Adderall XR. Energy has ben low with recent infection. She reports that she has not been on regular eating schedule. Tries to bring fruit and high protein snacks while on the road for work. Describes sleep as "here and there." She reports that today was the first day in months that she has felt well rested. She reports that she typically has had multiple awakenings. Denies SI.   Some work stress. Driving to multiple locations for work. She reports that she likes her job overall.   Reports taking Xanax 2 mg 1/2-1 tab some of the time. Typically only takes at night.   Moving to [place], [place] in early December.   Xanax last filled 08/23/21 x 2 Adderall last filled 08/23/21  Adderall XR Vyvanse Ritalin Xanax Propranolol Buspar-Adverse reaction Cymbalta Sertraline Celexa Prozac Lexapro Wellbutrin XL- Somewhat helpful Strattera Lamictal Trazodone- Caused hallucinations Clonidine  PHQ2-9    Flowsheet  Row Office Visit from 02/17/2017 in [facility] at [facility] from 11/19/2016 in [facility] at [facility] from 10/08/2016 in [facility] at [facility] from 02/21/2015 in [facility] from 01/19/2015 in Primary Care at [facility]  PHQ-2 Total Score 0 0 0 0 0      Flowsheet Row ED from 09/17/2021 in [facility] from 09/16/2021 in [facility] from 09/02/2021 in [facility]  C-SSRS RISK CATEGORY No Risk No Risk No Risk        Review of Systems:  Review of Systems  Cardiovascular:  Positive for chest pain.  Gastrointestinal:  Positive for abdominal pain.  Musculoskeletal:  Positive for back pain. Negative for gait problem.  Psychiatric/Behavioral:         Please refer to HPI    Medications: I have reviewed the patient's current medications.  Current Outpatient Medications  Medication Sig Dispense Refill   amoxicillin-clavulanate (AUGMENTIN) 875-125 MG tablet Take 1 tablet by mouth every 12 (twelve) hours. 14 tablet 0   azaTHIOprine (IMURAN) 50 MG tablet Take 1 tablet by mouth daily.     etonogestrel (NEXPLANON) 68 MG IMPL implant Nexplanon 68 mg subdermal implant  Inject 1 implant by subcutaneous route.     albuterol (VENTOLIN HFA) 108 (90 Base) MCG/ACT inhaler Inhale 2 puffs into the lungs every 6 (six) hours as needed for wheezing. 1 Inhaler 0   alprazolam (XANAX) 2 MG tablet Take 1 tablet (  2 mg total) by mouth daily as needed for anxiety. 30 tablet 2   [START ON 12/10/2021] amphetamine-dextroamphetamine (ADDERALL XR) 30 MG 24 hr capsule Take 1 capsule (30 mg total) by mouth daily after breakfast. 30 capsule 0   [START ON 11/12/2021] amphetamine-dextroamphetamine (ADDERALL XR) 30 MG 24 hr capsule Take 1 capsule (30 mg total) by mouth daily after breakfast. 30 capsule 0   [START ON 10/15/2021] amphetamine-dextroamphetamine (ADDERALL XR) 30  MG 24 hr capsule Take 1 capsule (30 mg total) by mouth daily after breakfast. 30 capsule 0   lamoTRIgine (LAMICTAL) 100 MG tablet Take 2 tablets (200 mg total) by mouth daily. 180 tablet 0   No current facility-administered medications for this visit.    Medication Side Effects: Other: Some adverse effects with Adderall XR.  Allergies:  Allergies  Allergen Reactions   Buspar [Buspirone] Rash   Ibuprofen Other (See Comments)    Patient reports a history of gastric ulcers, states she is does not tolerate ibuprofen   Naproxen Rash    Past Medical History:  Diagnosis Date   Anxiety    Arthritis    Depression    Insomnia    Lupus (HCC)     Past Medical History, Surgical history, Social history, and Family history were reviewed and updated as appropriate.   Please see review of systems for further details on the patient's review from today.   Objective:   Physical Exam:  BP 115/70   Pulse 78   Physical Exam Constitutional:      General: She is not in acute distress. Musculoskeletal:        General: No deformity.  Neurological:     Mental Status: She is alert and oriented to person, place, and time.     Coordination: Coordination normal.  Psychiatric:        Attention and Perception: Attention and perception normal. She does not perceive auditory or visual hallucinations.        Mood and Affect: Mood is not depressed. Affect is not labile, blunt, angry or inappropriate.        Speech: Speech normal.        Behavior: Behavior normal.        Thought Content: Thought content normal. Thought content is not paranoid or delusional. Thought content does not include homicidal or suicidal ideation. Thought content does not include homicidal or suicidal plan.        Cognition and Memory: Cognition and memory normal.        Judgment: Judgment normal.     Comments: Insight intact Mood is somewhat anxious in response to recent health issues     Lab Review:     Component Value  Date/Time   NA 139 09/17/2021 0302   K 4.1 09/17/2021 0302   CL 103 09/17/2021 0302   CO2 25 09/02/2021 1953   GLUCOSE 108 (H) 09/17/2021 0302   BUN 9 09/17/2021 0302   CREATININE 0.60 09/17/2021 0302   CALCIUM 9.7 09/02/2021 1953   PROT 7.7 10/10/2017 1811   ALBUMIN 4.2 10/10/2017 1811   AST 23 10/10/2017 1811   ALT 18 10/10/2017 1811   ALKPHOS 57 10/10/2017 1811   BILITOT 0.7 10/10/2017 1811   GFRNONAA >60 09/02/2021 1953   GFRAA >60 09/08/2019 0006       Component Value Date/Time   WBC 14.5 (H) 09/17/2021 0253   RBC 5.05 09/17/2021 0253   HGB 14.3 09/17/2021 0302   HCT 42.0 09/17/2021 0302   PLT 229  09/17/2021 0253   MCV 82.6 09/17/2021 0253   MCH 28.9 09/17/2021 0253   MCHC 35.0 09/17/2021 0253   RDW 11.7 09/17/2021 0253   LYMPHSABS 2.2 09/17/2021 0253   MONOABS 0.9 09/17/2021 0253   EOSABS 0.0 09/17/2021 0253   BASOSABS 0.1 09/17/2021 0253    No results found for: "POCLITH", "LITHIUM"   No results found for: "PHENYTOIN", "PHENOBARB", "VALPROATE", "CBMZ"   .res Assessment: Plan:    Pt seen for 30 minutes and time spent discussing recent health issues and the effect that this has had on her mood and anxiety symptoms. She reports that she has noticed some improvement in mood and anxiety now that some of her acute medical issues and infection are improving. She reports that she has been reducing use of Xanax periodically. She reports that she prefers to continue current medications without changes at this time.  Continue Alprazolam 2 mg po qd prn anxiety.  Continue Adderall XR 30 mg po qd for ADHD.  Recommend continuing therapy with [name], [facility].  Pt to follow-up in 2 months or sooner if clinically indicated.  Patient advised to contact office with any questions, adverse effects, or acute worsening in signs and symptoms.   Bethany Terry was seen today for anxiety and follow-up.  Diagnoses and all orders for this visit:  Persistent depressive disorder with  atypical features, currently moderate -     lamoTRIgine (LAMICTAL) 100 MG tablet; Take 2 tablets (200 mg total) by mouth daily.  Generalized anxiety disorder -     alprazolam (XANAX) 2 MG tablet; Take 1 tablet (2 mg total) by mouth daily as needed for anxiety.  Panic disorder -     alprazolam (XANAX) 2 MG tablet; Take 1 tablet (2 mg total) by mouth daily as needed for anxiety.  Insomnia disorder, with non-sleep disorder mental comorbidity, episodic -     alprazolam (XANAX) 2 MG tablet; Take 1 tablet (2 mg total) by mouth daily as needed for anxiety.  Attention deficit hyperactivity disorder (ADHD), combined type, moderate -     amphetamine-dextroamphetamine (ADDERALL XR) 30 MG 24 hr capsule; Take 1 capsule (30 mg total) by mouth daily after breakfast. -     amphetamine-dextroamphetamine (ADDERALL XR) 30 MG 24 hr capsule; Take 1 capsule (30 mg total) by mouth daily after breakfast. -     amphetamine-dextroamphetamine (ADDERALL XR) 30 MG 24 hr capsule; Take 1 capsule (30 mg total) by mouth daily after breakfast.     Please see After Visit Summary for patient specific instructions.  Future Appointments  Date Time Provider Department Center  10/15/2021  9:00 AM [name], [facility] CP-[facility] None  10/29/2021  8:00 AM [name], [facility] CP-CP None  12/02/2021  8:30 AM Corie Chiquito, [facility] CP-CP None    No orders of the defined types were placed in this encounter.   -------------------------------

## 2021-10-15 ENCOUNTER — Ambulatory Visit (INDEPENDENT_AMBULATORY_CARE_PROVIDER_SITE_OTHER): Payer: 59 | Admitting: Psychiatry

## 2021-10-15 DIAGNOSIS — F411 Generalized anxiety disorder: Secondary | ICD-10-CM | POA: Diagnosis not present

## 2021-10-15 NOTE — Progress Notes (Signed)
      [facility] Counselor/Therapist Progress Note  Patient ID: Bethany Terry, MRN: [id number],    Date: 10/15/2021  Time Spent: 50 minutes start time 9:06 AM end time 9:56 AM  Treatment Type: Individual Therapy  Reported Symptoms: anxiety, depression, triggered responses,intrusive thoughts, health issues  Mental Status Exam:  Appearance:   Well Groomed     Behavior:  Appropriate  Motor:  Normal  Speech/Language:   Normal Rate  Affect:  Appropriate  Mood:  anxious and sad  Thought process:  normal  Thought content:    WNL  Sensory/Perceptual disturbances:    WNL  Orientation:  oriented to person, place, time/date, and situation  Attention:  Good  Concentration:  Good  Memory:  WNL  Fund of knowledge:   Good  Insight:    Good  Judgment:   Good  Impulse Control:  Good   Risk Assessment: Danger to Self:  No Self-injurious Behavior: No Danger to Others: No Duty to Warn:no Physical Aggression / Violence:No  Access to Firearms a concern: No  Gang Involvement:No   Subjective: Patient was present for session.  She shared that she is still having health issues and not feeling good.  She is still working with providers to try and figure out what is going on with her. Her health issues are creating anxiety for her because she is struggling with going to all of the appointments she has to go to.  She shared she is going to move December 1st 2023. Patient reported that there are lots of anxieties surrounding the move even though she is trying to stay hopeful. Encouraged patient to think through what she needs to do for herself.  Patient is also trying to set limits at work due to having lots of people contact her just to talk after there appointments.  She is feeling good about her time with patient due to being able to catch life threatening symptoms.   Interventions: Solution-Oriented/Positive Psychology and Insight-Oriented  Diagnosis:   ICD-10-CM   1. Generalized anxiety  disorder  F41.1       Plan: Patient is to use CBT and coping skills to decrease anxiety symptoms.  Patient is to go back to the emergency room or see her primary care physician sometime during the day.  Patient is to focus on what she can control fix and change.  Patient is to continue working with medical providers on her medical issues.  Patient is to exercise to release negative emotions appropriately.  Patient is to work on the 21-day detox steps for the neuro cycle by Dr. Tawanna Solo to help make sure her thoughts are moving in a positive direction.  Patient is to journal what processes between sessions.  Patient is to take medication as directed. Long-term goal: Resolve the core conflict that is the source of anxiety Short-term goal: Describe current and past experiences with specific fears prominent worries and anxiety symptoms including their impact on functioning and attempts to resolve it  Bethany Terry, [facility]

## 2021-10-29 ENCOUNTER — Ambulatory Visit: Payer: 59 | Admitting: Psychiatry

## 2021-10-29 IMAGING — CR DG LUMBAR SPINE 2-3V
3 series · 3 of 3 positions shown · non-contrast
Comparison: 10/08/2016

CLINICAL DATA: Low back pain, pelvic bleeding and cramping

EXAM:
LUMBAR SPINE - 2-3 VIEW

[t l-spine a.p.]
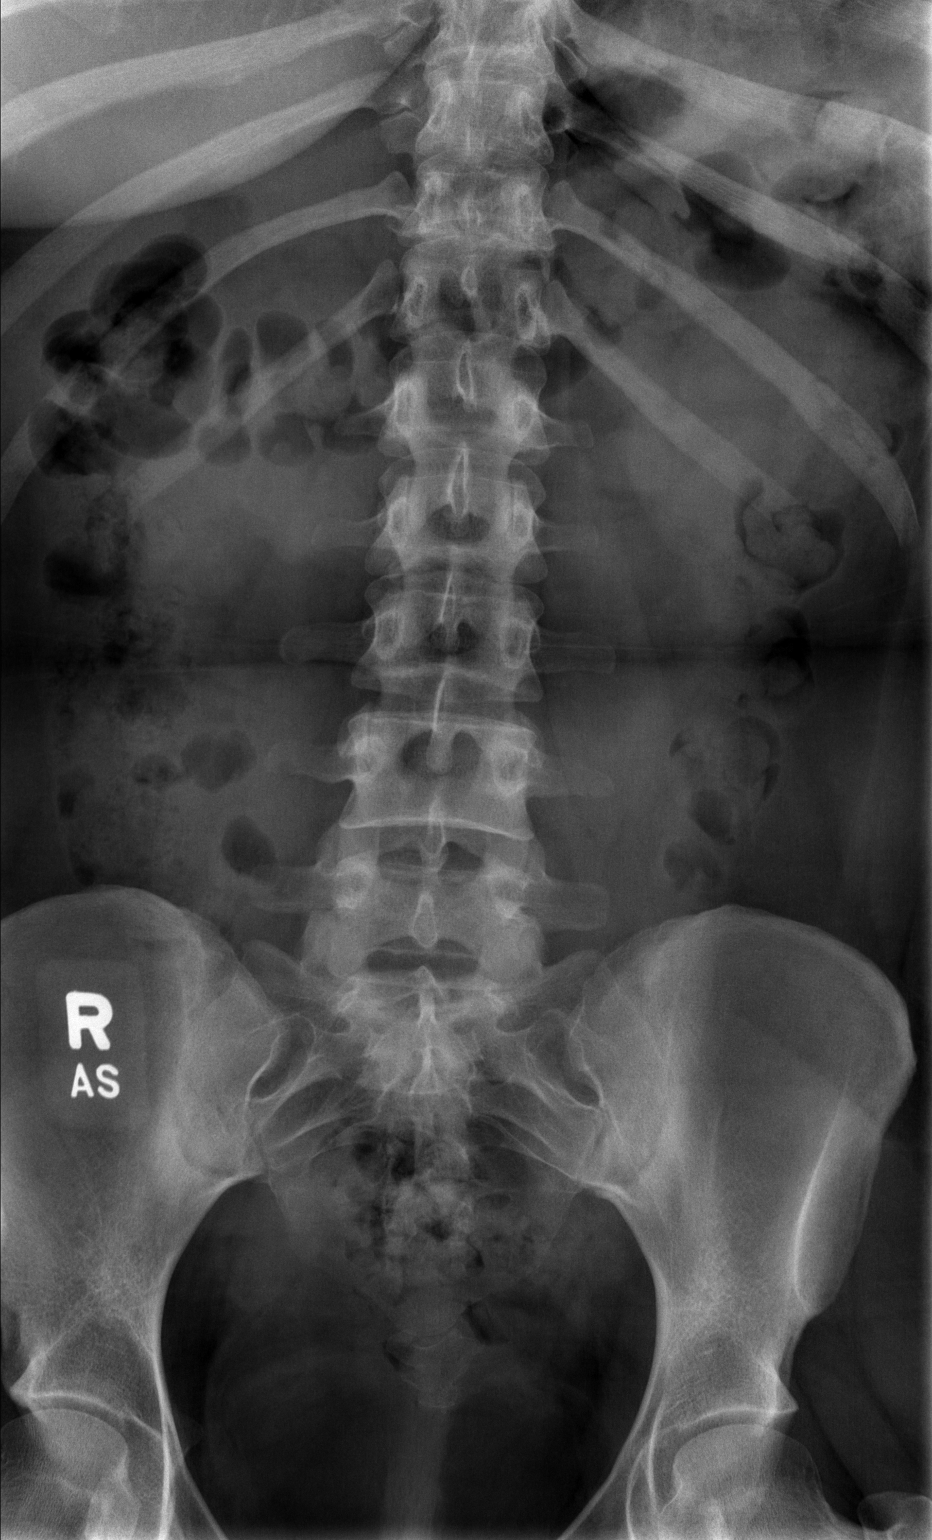

[t l-spine lat]
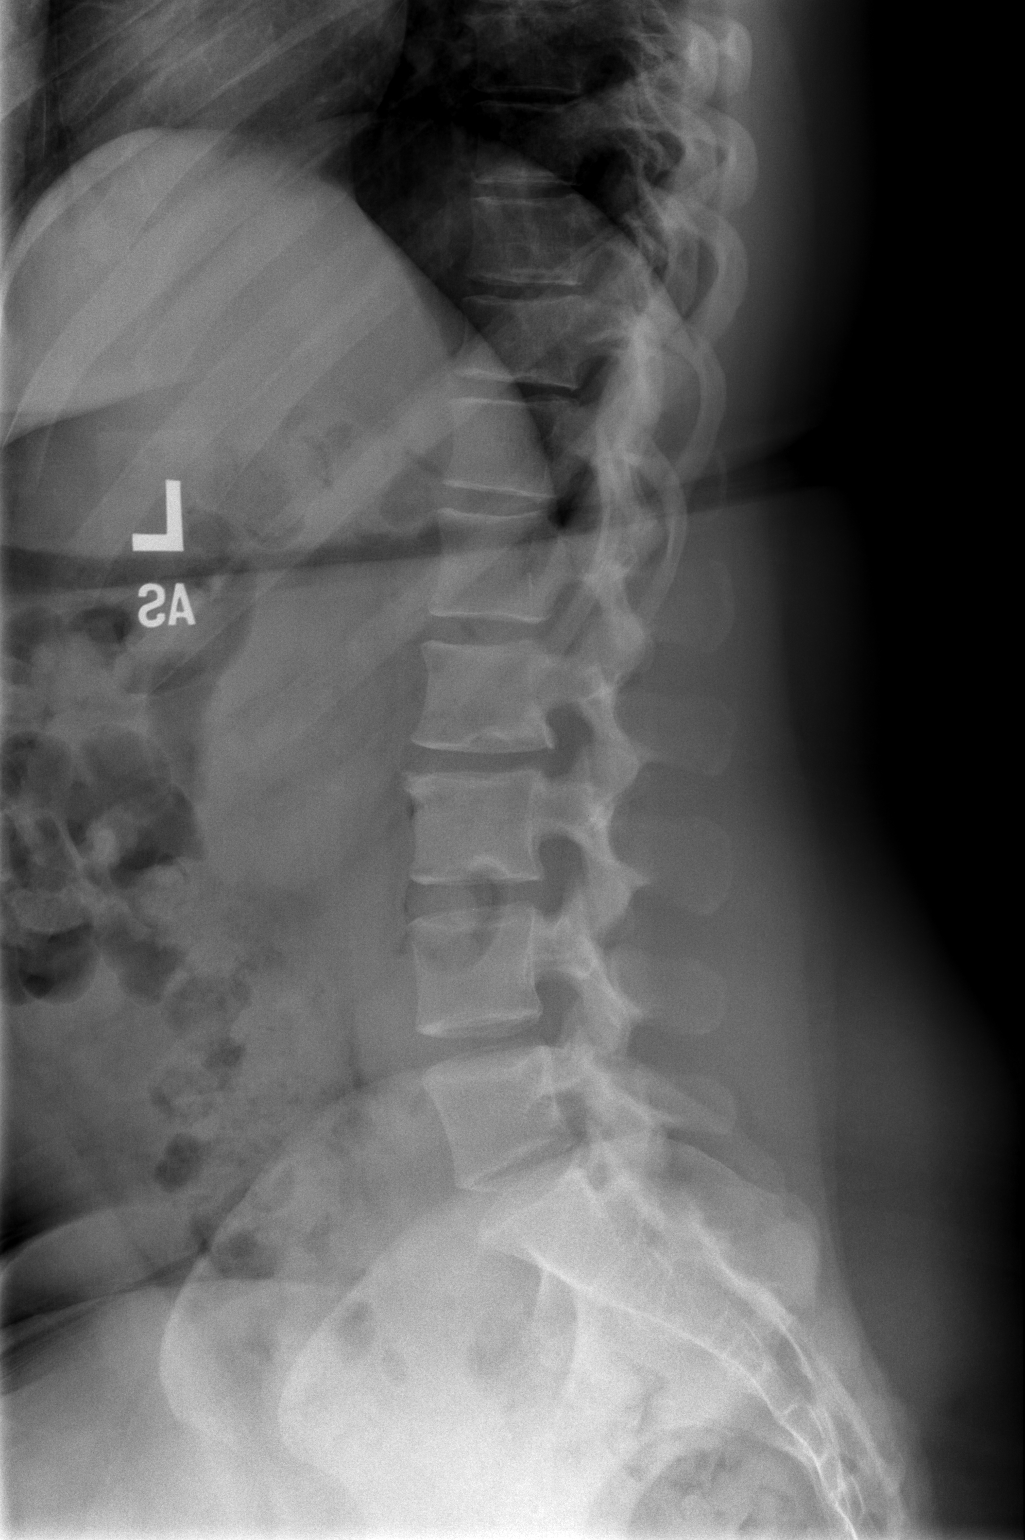

[t l-spine l5-s1 spot]
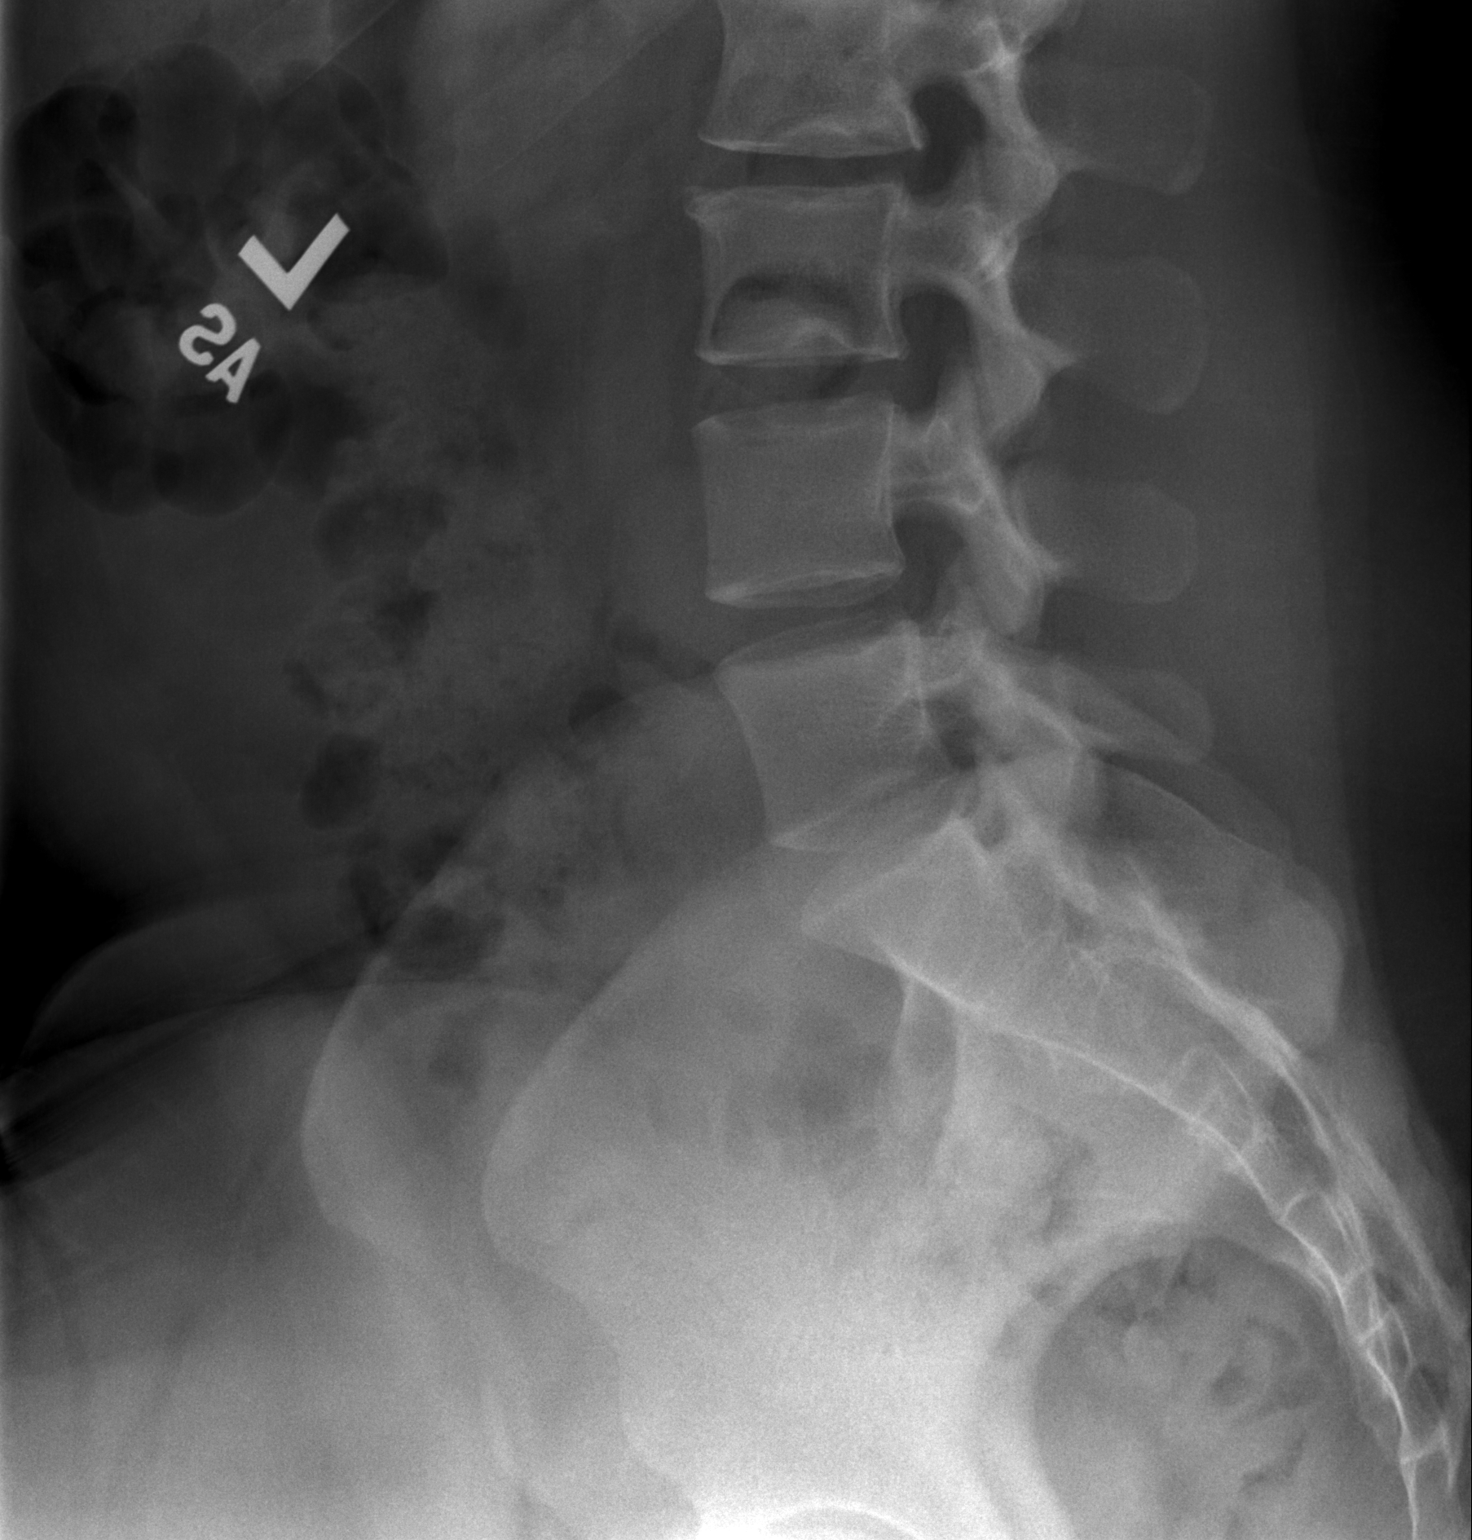

[3 of 3 positions shown; findings below may reference images not displayed]

FINDINGS: 5 non-rib-bearing lumbar vertebra.

Osseous mineralization normal.

Vertebral body and disc space heights maintained.

No fracture, subluxation, or bone destruction.

Small Schmorl's nodes at the inferior endplates of L2 and L3.

No spondylolysis.

SI joints preserved.
IMPRESSION: No acute abnormalities.

## 2021-10-29 IMAGING — US US PELVIS COMPLETE WITH TRANSVAGINAL
1 series · 13 of 25 positions shown · non-contrast
Comparison: 10/10/2017

CLINICAL DATA: Vaginal bleeding, cramping, back pain, symptoms for
1 week, bleeding initially heavy then became lighter/spotting. Birth
control implant in arm, no menses for a year

EXAM:
TRANSABDOMINAL AND TRANSVAGINAL ULTRASOUND OF PELVIS
TECHNIQUE: Both transabdominal and transvaginal ultrasound examinations of the
pelvis were performed. Transabdominal technique was performed for
global imaging of the pelvis including uterus, ovaries, adnexal
regions, and pelvic cul-de-sac. It was necessary to proceed with
endovaginal exam following the transabdominal exam to visualize the
endometrium and adnexa.

[Series 1: us pelvis complete with transvaginal · 0.21mm/px · 13 of 68 slices shown]
[im 1/68]
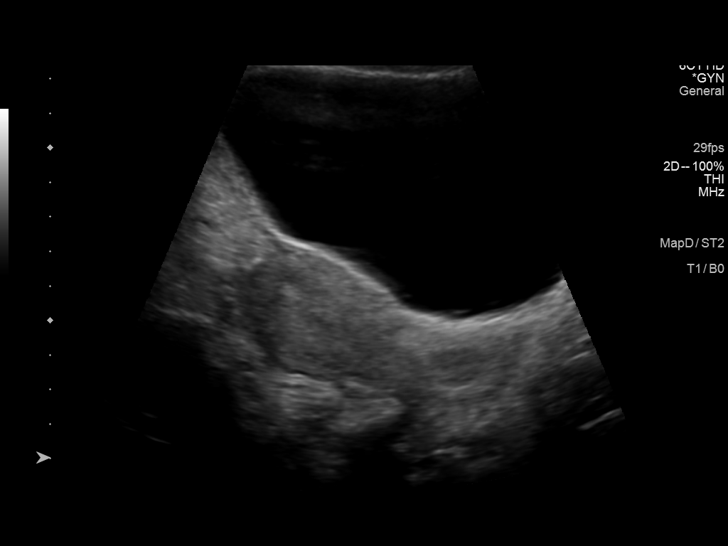
[im 6/68]
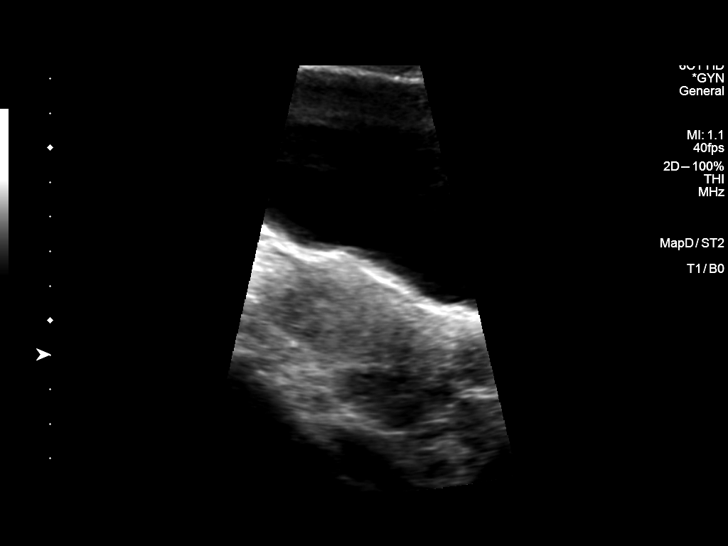
[im 12/68]
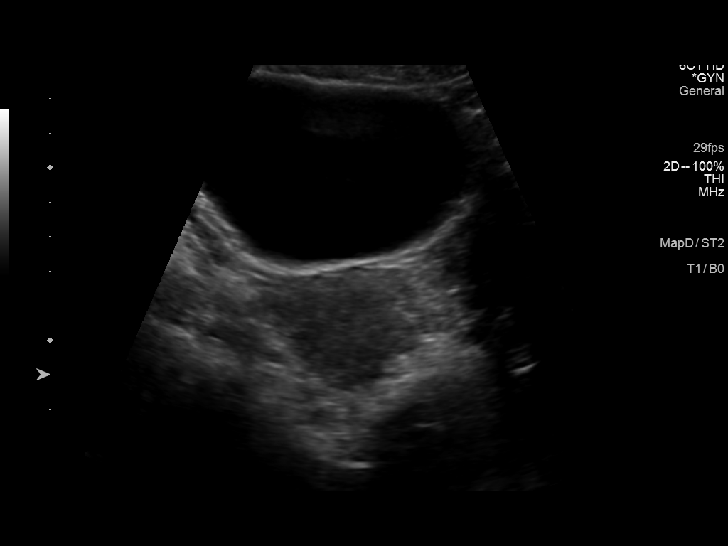
[im 17/68]
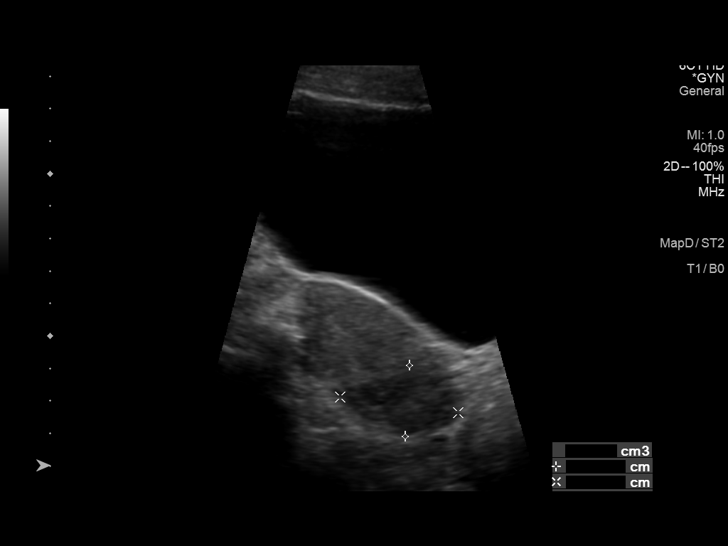
[im 23/68]
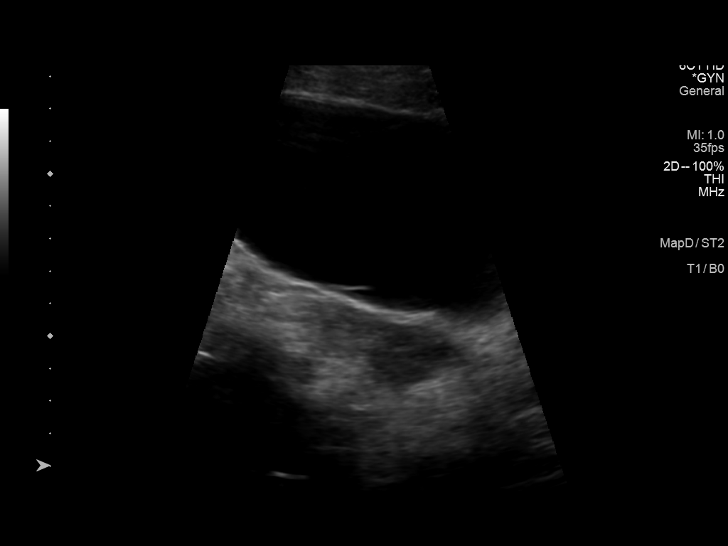
[im 28/68]
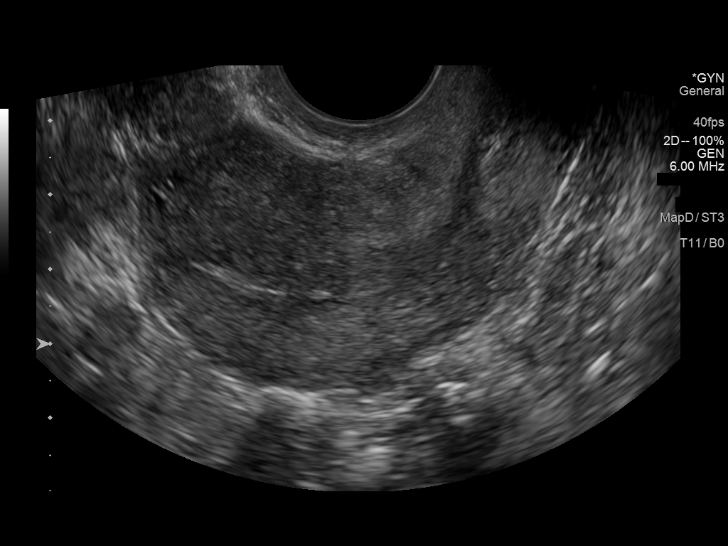
[im 34/68]
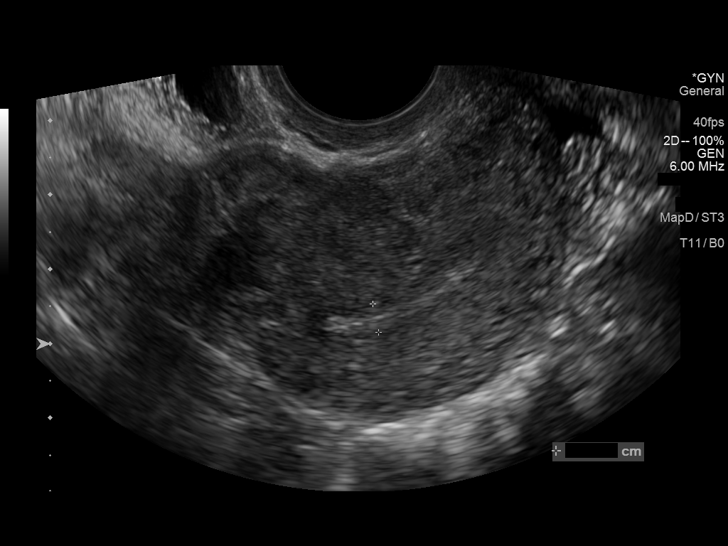
[im 40/68]
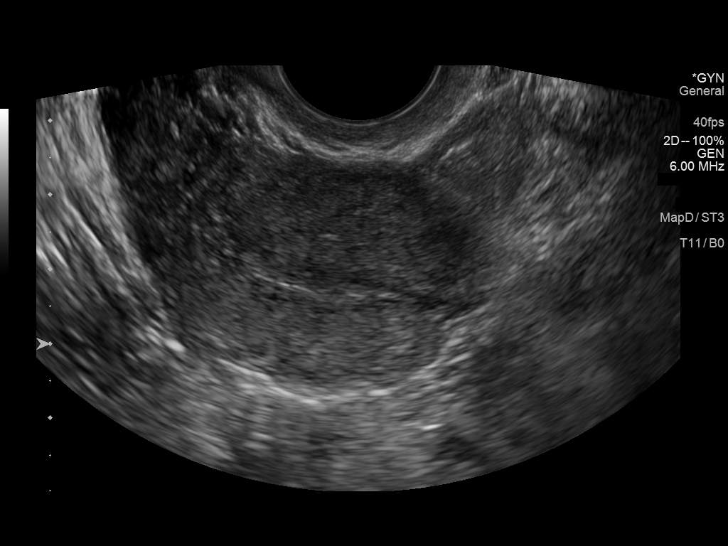
[im 45/68]
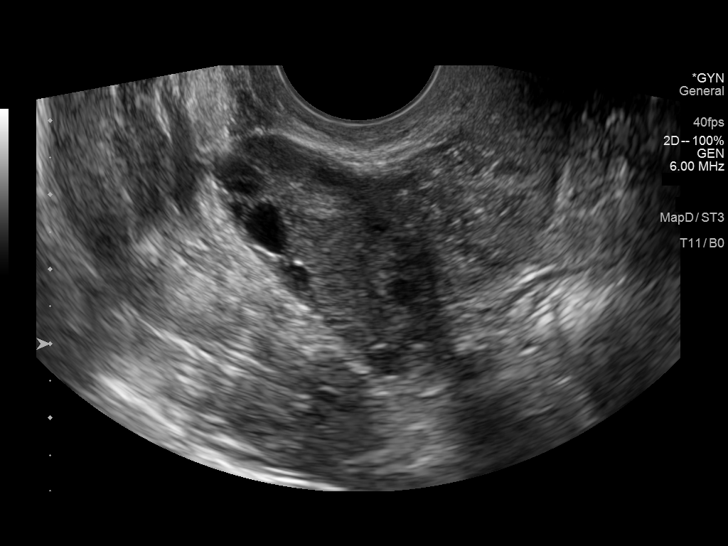
[im 51/68]
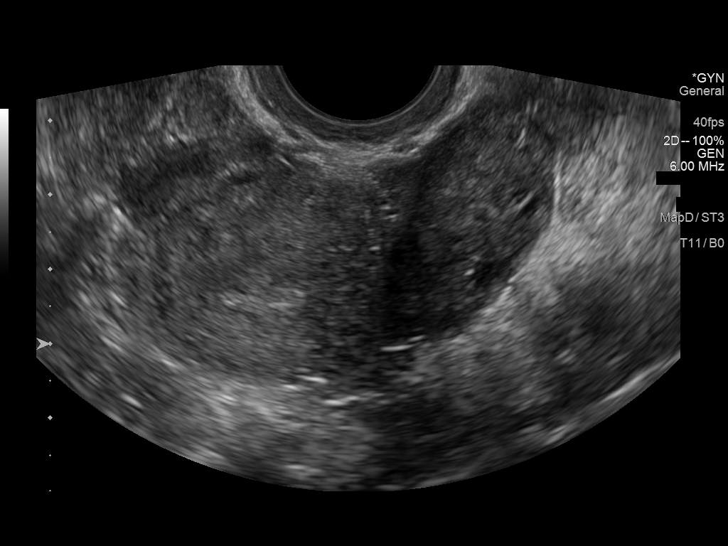
[im 56/68]
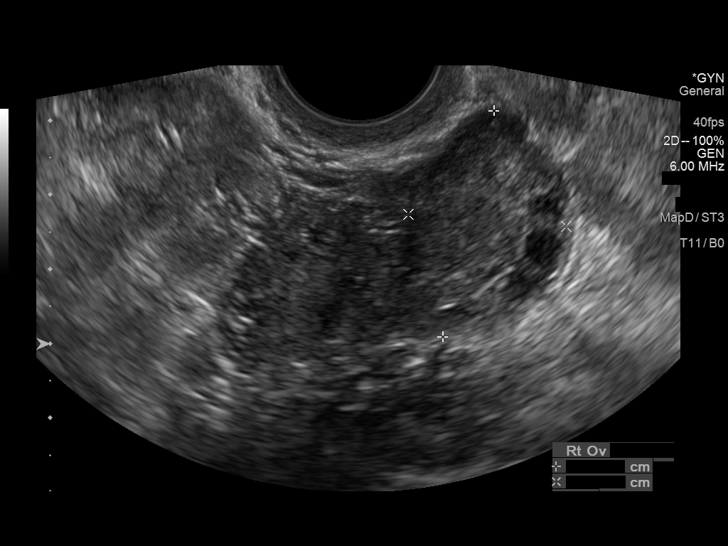
[im 62/68]
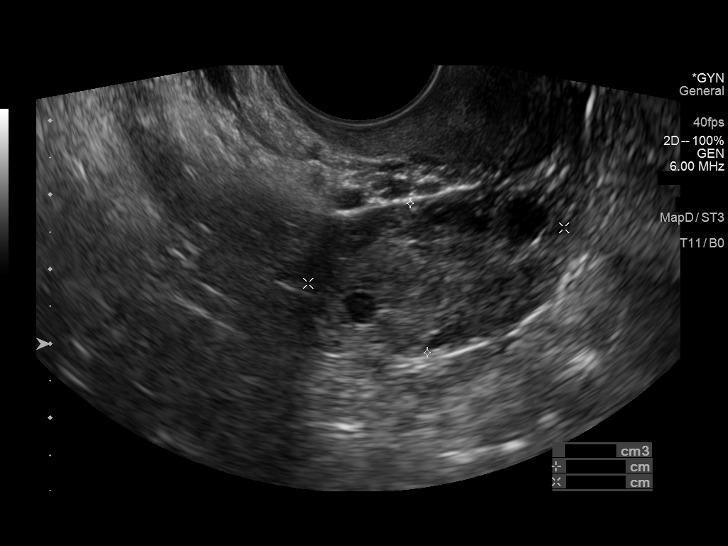
[im 68/68]
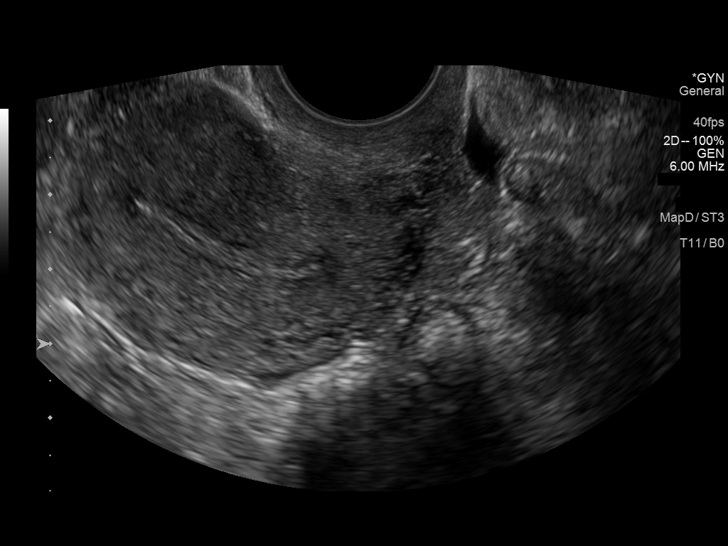

[13 of 25 positions shown; findings below may reference images not displayed]

FINDINGS: Uterus

Measurements: 8.3 x 3.4 x 4.3 cm = volume: 63 mL. Anteverted.
Heterogeneous myometrium, asymmetrically thicker anteriorly, but
without definite shadowing. Adenomyosis not excluded. No discrete
mass.

Endometrium

Thickness: 4 mm.  No endometrial fluid or mass

Right ovary

Measurements: 3.6 x 2.1 x 3.1 cm = volume: 12.6 mL. Normal
morphology without mass. Blood flow present within RIGHT ovary on
color Doppler imaging.

Left ovary

Measurements: 3.5 x 2.0 x 2.8 cm = volume: 10.6 mL. Normal
morphology without mass. Blood flow present within LEFT ovary on
color Doppler imaging.

Other findings

Trace free pelvic fluid.  No adnexal masses.
IMPRESSION: Heterogeneous myometrium, asymmetrically thicker at anterior wall,
nonspecific but cannot exclude adenomyosis with this appearance.

Remainder of exam unremarkable.

## 2021-12-02 ENCOUNTER — Ambulatory Visit (INDEPENDENT_AMBULATORY_CARE_PROVIDER_SITE_OTHER): Payer: Self-pay | Admitting: Psychiatry

## 2021-12-02 DIAGNOSIS — Z91199 Patient's noncompliance with other medical treatment and regimen due to unspecified reason: Secondary | ICD-10-CM

## 2021-12-03 NOTE — Progress Notes (Signed)
Patient did not show for scheduled office visit.   

## 2021-12-17 ENCOUNTER — Ambulatory Visit (INDEPENDENT_AMBULATORY_CARE_PROVIDER_SITE_OTHER): Payer: 59 | Admitting: Psychiatry

## 2021-12-17 DIAGNOSIS — F411 Generalized anxiety disorder: Secondary | ICD-10-CM | POA: Diagnosis not present

## 2021-12-17 NOTE — Progress Notes (Signed)
      [facility] Counselor/Therapist Progress Note  Patient ID: Bethany Terry, MRN: [id number],    Date: 12/17/2021  Time Spent: 48 minutes start time 1:12 PM end time 2:00 PM  Treatment Type: Individual Therapy  Reported Symptoms: anxiety, triggered responses, excessive spending, impulsive behavior. irritability  Mental Status Exam:  Appearance:   Well Groomed     Behavior:  Appropriate  Motor:  Normal  Speech/Language:   Normal Rate  Affect:  Appropriate  Mood:  anxious  Thought process:  normal  Thought content:    WNL  Sensory/Perceptual disturbances:    WNL  Orientation:  oriented to person, place, time/date, and situation  Attention:  Good  Concentration:  Good  Memory:  WNL  Fund of knowledge:   Good  Insight:    Good  Judgment:   Good  Impulse Control:  Good   Risk Assessment: Danger to Self:  No Self-injurious Behavior: No Danger to Others: No Duty to Warn:no Physical Aggression / Violence:No  Access to Firearms a concern: No  Gang Involvement:No   Subjective: Patient was present for session. She shared that she is continuing to have health issues. She has missed last few appointments with her rheumatologist just because she is so frustrated with the whole situation and fearful that things will not change.  Interventions: {[phone]}  Diagnosis:   ICD-10-CM   1. Generalized anxiety disorder  F41.1       Plan: Patient is to use CBT and coping skills to decrease anxiety symptoms.  Patient is to continue working with physicians on the different issues that she is having medically and try to find something positive through the journey.  Patient is to follow plans from session to set appropriate limits with her mother as she interacts with her for her birthday.  Patient is to focus on what she can control fix and change.  Patient is to continue working with medical providers on her medical issues.  Patient is to exercise to release negative emotions  appropriately.  Patient is to work on the 21-day detox steps for the neuro cycle by Dr. De Burrs to help make sure her thoughts are moving in a positive direction.  Patient is to journal what processes between sessions.  Patient is to take medication as directed. Long-term goal: Resolve the core conflict that is the source of anxiety Short-term goal: Describe current and past experiences with specific fears prominent worries and anxiety symptoms including their impact on functioning and attempts to resolve it  [name], [facility]

## 2021-12-18 ENCOUNTER — Encounter: Payer: Self-pay | Admitting: Psychiatry

## 2021-12-27 ENCOUNTER — Telehealth (INDEPENDENT_AMBULATORY_CARE_PROVIDER_SITE_OTHER): Payer: 59 | Admitting: Psychiatry

## 2021-12-27 ENCOUNTER — Encounter: Payer: Self-pay | Admitting: Psychiatry

## 2021-12-27 VITALS — BP 95/65 | HR 101

## 2021-12-27 DIAGNOSIS — F410 Panic disorder [episodic paroxysmal anxiety] without agoraphobia: Secondary | ICD-10-CM

## 2021-12-27 DIAGNOSIS — F5105 Insomnia due to other mental disorder: Secondary | ICD-10-CM

## 2021-12-27 DIAGNOSIS — F39 Unspecified mood [affective] disorder: Secondary | ICD-10-CM

## 2021-12-27 DIAGNOSIS — F988 Other specified behavioral and emotional disorders with onset usually occurring in childhood and adolescence: Secondary | ICD-10-CM

## 2021-12-27 DIAGNOSIS — F411 Generalized anxiety disorder: Secondary | ICD-10-CM

## 2021-12-27 MED ORDER — AMPHETAMINE-DEXTROAMPHETAMINE 20 MG PO TABS: 20.0000 mg | ORAL_TABLET | Freq: Two times a day (BID) | ORAL | 0 refills | Status: DC

## 2021-12-27 MED ORDER — ALPRAZOLAM 2 MG PO TABS: 2.0000 mg | ORAL_TABLET | Freq: Every day | ORAL | 0 refills | Status: DC | PRN

## 2021-12-27 MED ORDER — LAMOTRIGINE 150 MG PO TABS: 150.0000 mg | ORAL_TABLET | Freq: Every day | ORAL | 1 refills | Status: DC

## 2021-12-27 MED ORDER — LURASIDONE HCL 40 MG PO TABS: ORAL_TABLET | ORAL | 0 refills | Status: DC

## 2021-12-27 NOTE — Progress Notes (Signed)
Bethany Terry [id number] [birth year] 21 y.o.  Virtual Visit via Video Note  I connected with pt @ on 12/27/21 at  9:30 AM EST by a video enabled telemedicine application and verified that I am speaking with the correct person using two identifiers.   I discussed the limitations of evaluation and management by telemedicine and the availability of in person appointments. The patient expressed understanding and agreed to proceed.  I discussed the assessment and treatment plan with the patient. The patient was provided an opportunity to ask questions and all were answered. The patient agreed with the plan and demonstrated an understanding of the instructions.   The patient was advised to call back or seek an in-person evaluation if the symptoms worsen or if the condition fails to improve as anticipated.  I provided 25 minutes of non-face-to-face time during this encounter.  The patient was located in her personal vehicle in .  The provider was located at [facility].   Bethany Terry, PMHNP   Subjective:   Patient ID:  Bethany Terry is a 21 y.o. (DOB 2000/09/11) female.  Chief Complaint:  Chief Complaint  Patient presents with   Manic Behavior   Depression   Insomnia    HPI Bethany Terry presents for follow-up of mood disturbance, anxiety, insomnia, and ADHD. She reports that she had been doing ok. She reports that she stopped Xanax about a month ago to avoid taking it on an ongoing basis. She has started reducing Lamictal to 150 mg daily and she is now experiencing irritability and "manic" symptoms. She has been spending excessively and more than she would normally spend. Denies any other impulsive or risky behaviors. She reports that she has had higher energy and "overly happy." Occ more talkative than usual. She has been having sleep disturbance. She reports that physical health issues are interfering with her sleep. She reports that sleep qty varies. She reports that she never  feels well rested. She reports that she has "been in a really depressive state" in response to recent events. She reports some mood lability. She reports that concentration is improved with Adderall and procrastinates if she has not taken Adderall. She reports that her appetite varies with GI s/s. Denies SI.   She notices that Lamictal seems to disrupt her sleep if she takes it close to bed. She felt more impulsive on Lamictal 200 mg than she has on 150 mg. She has been taking 150 mg dose for 2-3 weeks. She reports that she is feeling less impulsive since dose decrease. She has noticed increased irritability with lower dose. She reports that she is moving administration time of Lamictal earlier in the day and currently is taking it around 2-3 pm. She feels that her sleep is gradually improving. She reports that she feels "calmer... less angry" with Lamictal.  She asks about taking Adderall IR instead of XR. She feels that she has had less side effects when Adderall IR has worn off compared to Adderall XR.  Adderall XR last filled 11/17/21. Alprazolam last filled 11/17/21.   Adderall XR Adderall IR Vyvanse Ritalin Xanax Propranolol Buspar-Adverse reaction Cymbalta Sertraline Celexa Prozac Lexapro Buspar-Rash Wellbutrin XL- Somewhat helpful Strattera Lamictal Trazodone- Caused hallucinations Clonidine    Review of Systems:  Review of Systems  Gastrointestinal:  Positive for abdominal distention, abdominal pain, constipation and nausea.  Musculoskeletal:  Positive for arthralgias and myalgias. Negative for gait problem.  Psychiatric/Behavioral:         Please refer to HPI  Medications: I have reviewed the patient's current medications.  Current Outpatient Medications  Medication Sig Dispense Refill   etonogestrel (NEXPLANON) 68 MG IMPL implant Nexplanon 68 mg subdermal implant  Inject 1 implant by subcutaneous route.     lurasidone (LATUDA) 40 MG TABS tablet Take 1/2 tablet  daily with supper for one week, then increase to 1 tablet daily with supper 90 tablet 0   albuterol (VENTOLIN HFA) 108 (90 Base) MCG/ACT inhaler Inhale 2 puffs into the lungs every 6 (six) hours as needed for wheezing. (Patient not taking: Reported on 12/27/2021) 1 Inhaler 0   amphetamine-dextroamphetamine (ADDERALL) 20 MG tablet Take 1 tablet (20 mg total) by mouth 2 (two) times daily. 60 tablet 0   azaTHIOprine (IMURAN) 50 MG tablet Take 1 tablet by mouth daily. (Patient not taking: Reported on 12/27/2021)     lamoTRIgine (LAMICTAL) 150 MG tablet Take 1 tablet (150 mg total) by mouth daily. 90 tablet 1   No current facility-administered medications for this visit.    Medication Side Effects: Other: Sleep disturbance with Lamictal. Reports increased impulsivity with Lamictal 200 mg.   Allergies:  Allergies  Allergen Reactions   Augmentin [Amoxicillin-Pot Clavulanate]     Reports palpitations and syncope   Buspar [Buspirone] Rash   Ibuprofen Other (See Comments)    Patient reports a history of gastric ulcers, states she is does not tolerate ibuprofen   Naproxen Rash    Past Medical History:  Diagnosis Date   Anxiety    Arthritis    Depression    Insomnia    Lupus (HCC)     Family History  Adopted: Yes    Social History   Socioeconomic History   Marital status: Significant Other    Spouse name: Not on file   Number of children: Not on file   Years of education: Not on file   Highest education level: Not on file  Occupational History   Not on file  Tobacco Use   Smoking status: Never   Smokeless tobacco: Never  Vaping Use   Vaping Use: Never used  Substance and Sexual Activity   Alcohol use: No   Drug use: No   Sexual activity: Yes    Birth control/protection: Implant  Other Topics Concern   Not on file  Social History Narrative   02/25/2014 Bethany Terry was born in [place], and adopted at 11 months by her current family. She lives with her maternal grandparents,  father, mother, and 66 year old brother. She began this year in the eighth grade at the [employer], but plans to transfer to [employer]. She enjoys hanging out with her friends, playing basketball and volleyball for her church leagues. 02/25/2012 Bethany   Current school [employer] in the 8th grade.   Social Determinants of Health   Financial Resource Strain: Not on file  Food Insecurity: Not on file  Transportation Needs: Not on file  Physical Activity: Not on file  Stress: Not on file  Social Connections: Not on file  Intimate Partner Violence: Not on file    Past Medical History, Surgical history, Social history, and Family history were reviewed and updated as appropriate.   Please see review of systems for further details on the patient's review from today.   Objective:   Physical Exam:  BP 95/65   Pulse (!) 101   Physical Exam Neurological:     Mental Status: She is alert and oriented to person, place, and time.  Cranial Nerves: No dysarthria.  Psychiatric:        Attention and Perception: Attention and perception normal.        Mood and Affect: Mood is depressed. Affect is blunt.        Speech: Speech normal.        Behavior: Behavior is cooperative.        Thought Content: Thought content normal. Thought content is not paranoid or delusional. Thought content does not include homicidal or suicidal ideation. Thought content does not include homicidal or suicidal plan.        Cognition and Memory: Cognition and memory normal.        Judgment: Judgment normal.     Comments: Insight intact     Lab Review:     Component Value Date/Time   NA 139 09/17/2021 0302   K 4.1 09/17/2021 0302   CL 103 09/17/2021 0302   CO2 25 09/02/2021 1953   GLUCOSE 108 (H) 09/17/2021 0302   BUN 9 09/17/2021 0302   CREATININE 0.60 09/17/2021 0302   CALCIUM 9.7 09/02/2021 1953   PROT 7.7 10/10/2017 1811   ALBUMIN 4.2 10/10/2017 1811   AST 23  10/10/2017 1811   ALT 18 10/10/2017 1811   ALKPHOS 57 10/10/2017 1811   BILITOT 0.7 10/10/2017 1811   GFRNONAA >60 09/02/2021 1953   GFRAA >60 09/08/2019 0006       Component Value Date/Time   WBC 14.5 (H) 09/17/2021 0253   RBC 5.05 09/17/2021 0253   HGB 14.3 09/17/2021 0302   HCT 42.0 09/17/2021 0302   PLT 229 09/17/2021 0253   MCV 82.6 09/17/2021 0253   MCH 28.9 09/17/2021 0253   MCHC 35.0 09/17/2021 0253   RDW 11.7 09/17/2021 0253   LYMPHSABS 2.2 09/17/2021 0253   MONOABS 0.9 09/17/2021 0253   EOSABS 0.0 09/17/2021 0253   BASOSABS 0.1 09/17/2021 0253    No results found for: "POCLITH", "LITHIUM"   No results found for: "PHENYTOIN", "PHENOBARB", "VALPROATE", "CBMZ"   .res Assessment: Plan:    Pt seen for 25 minutes and time spent discussing treatment plan. Discussed that she could move administration time of Lamictal to morning without gradually shifting time to minimize sleep disturbance. Will continue Lamictal 150 mg daily for mood stabilization.  Discussed potential benefits, risks, and side effects of Latuda. Discussed potential metabolic side effects associated with atypical antipsychotics, as well as potential risk for movement side effects. Advised pt to contact office if movement side effects occur. Will start Latuda 40 mg 1/2 tab daily with supper for one week, then increase to 1 tablet daily for mood symptoms.  Will change Adderall XR to Adderall IR since pt reports that this was better tolerated than XR in the past. Will re-start Adderall 20 mg in the morning and 1/2-1 tablet in the afternoon for ADHD.  Will change Xanax 2 mg to as needed only.  Recommend continuing therapy with [facility]olly [facility], [facility].  Pt to follow-up with this provider in 6 weeks or sooner if clinically indicated.  Patient advised to contact office with any questions, adverse effects, or acute worsening in signs and symptoms.   Bethany Terry was seen today for manic behavior, depression and  insomnia.  Diagnoses and all orders for this visit:  Episodic mood disorder (HCC) -     lurasidone (LATUDA) 40 MG TABS tablet; Take 1/2 tablet daily with supper for one week, then increase to 1 tablet daily with supper -     lamoTRIgine (LAMICTAL) 150  MG tablet; Take 1 tablet (150 mg total) by mouth daily.  ADD (attention deficit disorder) without hyperactivity -     amphetamine-dextroamphetamine (ADDERALL) 20 MG tablet; Take 1 tablet (20 mg total) by mouth 2 (two) times daily.  Generalized anxiety disorder     Please see After Visit Summary for patient specific instructions.  Future Appointments  Date Time Provider Department Center  02/11/2022  8:00 AM [name], [facility] None    No orders of the defined types were placed in this encounter.     -------------------------------

## 2022-01-21 ENCOUNTER — Telehealth: Payer: Self-pay | Admitting: Psychiatry

## 2022-01-21 DIAGNOSIS — F39 Unspecified mood [affective] disorder: Secondary | ICD-10-CM

## 2022-01-21 MED ORDER — LAMOTRIGINE 150 MG PO TABS: 150.0000 mg | ORAL_TABLET | Freq: Every day | ORAL | 1 refills | Status: DC

## 2022-01-21 NOTE — Telephone Encounter (Signed)
Received request that pt would like [facility] [place], [place]. Script sent.

## 2022-02-11 ENCOUNTER — Ambulatory Visit (INDEPENDENT_AMBULATORY_CARE_PROVIDER_SITE_OTHER): Payer: 59 | Admitting: Psychiatry

## 2022-02-11 DIAGNOSIS — F411 Generalized anxiety disorder: Secondary | ICD-10-CM

## 2022-02-11 NOTE — Progress Notes (Signed)
[facility] Counselor/Therapist Progress Note  Patient ID: Bethany Terry, MRN: [id number],    Date: 02/11/2022  Time Spent: 50 minutes start time 8:03 AM end time 8:53 AM  Treatment Type: Individual Therapy  Reported Symptoms: anxiety, triggered responses, fatigue, health issues, sleep issues  Mental Status Exam:  Appearance:   Casual and Neat     Behavior:  Appropriate  Motor:  Normal  Speech/Language:   Normal Rate  Affect:  Appropriate  Mood:  anxious  Thought process:  normal  Thought content:    WNL  Sensory/Perceptual disturbances:    WNL  Orientation:  oriented to person, place, time/date, and situation  Attention:  Good  Concentration:  Good  Memory:  WNL  Fund of knowledge:   Good  Insight:    Good  Judgment:   Good  Impulse Control:  Good   Risk Assessment: Danger to Self:  No Self-injurious Behavior: No Danger to Others: No Duty to Warn:no Physical Aggression / Violence:No  Access to Firearms a concern: No  Gang Involvement:No   Subjective: Patient was present for session. She shared that she has been out of town for several weeks.She shared that her her parents are the same. She is heading to [place] to visit a friend at the end of the month. She is realizing that she is not sure if she wants to stay with her boyfriend long term.  Patient was encouraged to think through what she wants for her future and what she is willing to live with and what she is not okay with.  Patient was able to recognize that she wants a relationship where she feels supported and pursued.  She is not sure that her boyfriend can do that she knows he loves her and is loyal but he struggles with being able to engage with her the way she needs.  Different ways for her to take time for herself and think through what is going to be the best decision for her were discussed in session.  The importance of her focusing on her health first was discussed with patient.  She also shared she wants to  figure out what she wants to do careerwise for her future.  Patient was encouraged to think through what she enjoys what she feels drawn to and then what she feels good at to try and help her think about options that she may want to start investigating.  Patient was able to recognize the direction that she wants to go in her future and ways to start exploring that option to see if she needs to return to school for discussed with patient.  Interventions: Solution-Oriented/Positive Psychology and Insight-Oriented  Diagnosis:   ICD-10-CM   1. Generalized anxiety disorder  F41.1       Plan: Patient is to use CBT and coping skills to decrease anxiety symptoms.  Patient is to continue working with physicians on the different issues that she is having medically and try to find something positive through the journey. Patient is to focus on what she can control fix and change.  Patient is to exercise to release negative emotions appropriately.  Patient is to work on the 21-day detox steps for the neuro cycle by Dr. Tawanna Solo to help make sure her thoughts are moving in a positive direction.  Patient is to start looking into different options for her future.  Patient is to take medication as directed. Long-term goal: Resolve the core conflict that is the source  of anxiety Short-term goal: Describe current and past experiences with specific fears prominent worries and anxiety symptoms including their impact on functioning and attempts to resolve it  [name], [facility]

## 2022-02-20 ENCOUNTER — Telehealth: Payer: Self-pay | Admitting: Psychiatry

## 2022-02-20 DIAGNOSIS — F988 Other specified behavioral and emotional disorders with onset usually occurring in childhood and adolescence: Secondary | ICD-10-CM

## 2022-02-20 DIAGNOSIS — F39 Unspecified mood [affective] disorder: Secondary | ICD-10-CM

## 2022-02-20 MED ORDER — LAMOTRIGINE 150 MG PO TABS: 150.0000 mg | ORAL_TABLET | Freq: Every day | ORAL | 1 refills | Status: DC

## 2022-02-20 NOTE — Telephone Encounter (Signed)
Sent!

## 2022-02-20 NOTE — Telephone Encounter (Signed)
Pt LVM@ 2:52p.  She wanted refill of Lamictal.  She did not get 90 days filled; only got a 30 day script filled in [place].  Pls send new script to [facility] in [place].    No upcoming appt scheduled.

## 2022-02-21 MED ORDER — AMPHETAMINE-DEXTROAMPHETAMINE 20 MG PO TABS: 20.0000 mg | ORAL_TABLET | Freq: Two times a day (BID) | ORAL | 0 refills | Status: DC

## 2022-02-21 NOTE — Telephone Encounter (Signed)
Received request from pt for 90-day supply of Adderall to be sent [facility]. Script sent.

## 2022-02-21 NOTE — Addendum Note (Signed)
Addended by: Sharyl Nimrod on: 02/21/2022 12:03 PM   Modules accepted: Orders

## 2022-03-20 ENCOUNTER — Other Ambulatory Visit (HOSPITAL_COMMUNITY): Payer: Self-pay | Admitting: Gastroenterology

## 2022-03-20 DIAGNOSIS — R1011 Right upper quadrant pain: Secondary | ICD-10-CM

## 2022-03-25 ENCOUNTER — Ambulatory Visit (INDEPENDENT_AMBULATORY_CARE_PROVIDER_SITE_OTHER): Payer: 59 | Admitting: Psychiatry

## 2022-03-25 DIAGNOSIS — F411 Generalized anxiety disorder: Secondary | ICD-10-CM

## 2022-03-25 NOTE — Progress Notes (Signed)
[facility] Counselor/Therapist Progress Note  Patient ID: Bethany Terry, MRN: [id number],    Date: 03/25/2022  Time Spent: 50 minutes start time 8:06 AM end time 8:56 AM  Treatment Type: Individual Therapy  Reported Symptoms: anxiety, health issues, sleep issues, triggered responses  Mental Status Exam:  Appearance:   Well Groomed     Behavior:  Appropriate  Motor:  Normal  Speech/Language:   Normal Rate  Affect:  Appropriate  Mood:  anxious  Thought process:  normal  Thought content:    WNL  Sensory/Perceptual disturbances:    WNL  Orientation:  oriented to person, place, time/date, and situation  Attention:  Good  Concentration:  Good  Memory:  WNL  Fund of knowledge:   Good  Insight:    Good  Judgment:   Good  Impulse Control:  Good   Risk Assessment: Danger to Self:  No Self-injurious Behavior: No Danger to Others: No Duty to Warn:no Physical Aggression / Violence:No  Access to Firearms a concern: No  Gang Involvement:No   Subjective: Patient was present for session. She shared she continues to have lots of medical issues and is seeing lots of doctors and having tests run over the next few weeks. She is recognizing that her relationship is not healthy for her. She went on to share she is trying to figure out what she will do.  Patient shared that he will be moving to [place] in May and they will have to start finding apartments in April so she is figuring out how to handle things with him prior to that date.  Discussed different options that she would have for herself in different ways to help her think through what is going to be best with her situation.  Patient reported that she is thankful that things are better with her work and she has had safer situations.  Patient did have an incident where she had extreme pain in her abdomen and she feels like a cyst burst but she passed out at other patients home due to the extreme pain.  Patient reported she is  getting go to her gynecologist to see if they can help her figure out what may be going on and if she can come off of her birth control that does not allow her to have periods.  Patient reported she is doing well with eating clean and walking which is helping her mood.  Encouraged her to recognize all that she has done well in the different ways that she has grown and to continue doing all the things that she knows will help with her self-care as she goes through her different medical testing as doctors appointments to see if they can figure out all the things that are going on with her and get her some relief.  Interventions: Solution-Oriented/Positive Psychology  Diagnosis:   ICD-10-CM   1. Generalized anxiety disorder  F41.1       Plan:  Patient is to use CBT and coping skills to decrease anxiety symptoms.  Patient is to continue working with physicians on the different issues that she is having medically and try to find something positive through the journey. Patient is to focus on what she can control fix and change.  Patient is to exercise to release negative emotions appropriately.  Patient is to work on the 21-day detox steps for the neuro cycle by Dr. Tawanna Solo to help make sure her thoughts are moving in a positive direction.  Patient is to start looking into different options for her future.  Patient is to take medication as directed. Long-term goal: Resolve the core conflict that is the source of anxiety Short-term goal: Describe current and past experiences with specific fears prominent worries and anxiety symptoms including their impact on functioning and attempts to resolve it  [name], [facility]

## 2022-03-31 ENCOUNTER — Encounter (HOSPITAL_COMMUNITY): Payer: Self-pay

## 2022-03-31 ENCOUNTER — Encounter (HOSPITAL_COMMUNITY): Admission: RE | Admit: 2022-03-31 | Payer: 59 | Source: Ambulatory Visit

## 2022-04-01 ENCOUNTER — Other Ambulatory Visit (HOSPITAL_COMMUNITY): Payer: Self-pay | Admitting: Gastroenterology

## 2022-04-01 DIAGNOSIS — R1011 Right upper quadrant pain: Secondary | ICD-10-CM

## 2022-04-07 ENCOUNTER — Ambulatory Visit (INDEPENDENT_AMBULATORY_CARE_PROVIDER_SITE_OTHER): Payer: 59 | Admitting: Psychiatry

## 2022-04-07 DIAGNOSIS — F411 Generalized anxiety disorder: Secondary | ICD-10-CM

## 2022-04-07 NOTE — Progress Notes (Signed)
[facility] Counselor/Therapist Progress Note  Patient ID: Bethany Terry, MRN: [id number],    Date: 04/07/2022  Time Spent: 47 minutes start time 8:13 AM end time 9 AM Virtual Visit via Video Note Connected with patient by a telemedicine/telehealth application, with their informed consent, and verified patient privacy and that I am speaking with the correct person using two identifiers. I discussed the limitations, risks, security and privacy concerns of performing psychotherapy and the availability of in person appointments. I also discussed with the patient that there may be a patient responsible charge related to this service. The patient expressed understanding and agreed to proceed. I discussed the treatment planning with the patient. The patient was provided an opportunity to ask questions and all were answered. The patient agreed with the plan and demonstrated an understanding of the instructions. The patient was advised to call  our office if  symptoms worsen or feel they are in a crisis state and need immediate contact.   Therapist Location: office Patient Location: home    Treatment Type: Individual Therapy  Reported Symptoms: anxiety, sadness, triggered responses, health issues   Mental Status Exam:  Appearance:   Well Groomed     Behavior:  Appropriate  Motor:  Normal  Speech/Language:   Normal Rate  Affect:  Appropriate  Mood:  anxious  Thought process:  normal  Thought content:    WNL  Sensory/Perceptual disturbances:    WNL  Orientation:  oriented to person, place, time/date, and situation  Attention:  Good  Concentration:  Good  Memory:  WNL  Fund of knowledge:   Good  Insight:    Good  Judgment:   Good  Impulse Control:  Good   Risk Assessment: Danger to Self:  No Self-injurious Behavior: No Danger to Others: No Duty to Warn:no Physical Aggression / Violence:No  Access to Firearms a concern: No  Gang Involvement:No   Subjective: Met with patient via  virtual session. She shared she was sick and is still having issues with her ears. She went on to share she is trying to figure out what to do in the future with her boyfriend.  She was able to think through the pros and cons of the situation. She went on to realize it is time for her to move forward.  Patient was allowed time to express her sadness over the situation since she has been with him almost half of her life and he is a supportive person in her life.  Patient was able to realize that she wants more from the relationship and there is currently an potentially them what he can do. She is going to work on her dad's apartment with her brother. She was able to realize that it is time for her to move in with him. Patient went on to share that she is continuing to have numbness in her body and went to the ED to try and figure out what is going on with her. She share that they couldn't figure it out and  she is having lots of test run to try and figure things out for her.  Patient shared she is feeling frustrated with the situation.  There are things that are positive and she is hopeful regarding some of her situation.  Patient was encouraged to focus on the fact that the situation is still temporary and she can get to a better place even though it may seem very difficult for her.  Patient was encouraged to utilize  coping skills as needed especially as she is going through all the testing and waiting to figure out what exactly is going on with her physically.  Reviewed them with patient in session.  Interventions: Solution-Oriented/Positive Psychology  Diagnosis:   ICD-10-CM   1. Generalized anxiety disorder  F41.1       Plan:  Patient is to use CBT and coping skills to decrease anxiety symptoms.  Patient is to continue working with physicians on the different issues that she is having medically and try to find something positive through the journey. Patient is to focus on what she can control fix and  change.  Patient is to exercise to release negative emotions appropriately.  Patient is to work on the 21-day detox steps for the neuro cycle by Dr. Tawanna Solo to help make sure her thoughts are moving in a positive direction.  Patient is to start looking into different options for her future.  Patient is to take medication as directed. Long-term goal: Resolve the core conflict that is the source of anxiety Short-term goal: Describe current and past experiences with specific fears prominent worries and anxiety symptoms including their impact on functioning and attempts to resolve it  Bethany Terry, [facility]

## 2022-04-15 ENCOUNTER — Ambulatory Visit (HOSPITAL_COMMUNITY): Payer: Managed Care, Other (non HMO)

## 2022-04-16 ENCOUNTER — Ambulatory Visit (HOSPITAL_COMMUNITY)
Admission: RE | Admit: 2022-04-16 | Discharge: 2022-04-16 | Disposition: A | Discharge status: Home / Self Care | Payer: 59 | Source: Ambulatory Visit | Attending: Gastroenterology | Admitting: Gastroenterology

## 2022-04-16 ENCOUNTER — Encounter (HOSPITAL_COMMUNITY)
Admission: RE | Admit: 2022-04-16 | Discharge: 2022-04-16 | Disposition: A | Discharge status: Home / Self Care | Payer: 59 | Source: Ambulatory Visit | Attending: Gastroenterology | Admitting: Gastroenterology

## 2022-04-16 DIAGNOSIS — R1011 Right upper quadrant pain: Secondary | ICD-10-CM | POA: Diagnosis present

## 2022-04-16 MED ORDER — TECHNETIUM TC 99M EXAMETAZIME IV KIT
5.1000 | PACK | Freq: Once | INTRAVENOUS | Status: AC | PRN
  Administered 2022-04-16: 5.1 via INTRAVENOUS

## 2022-04-21 ENCOUNTER — Ambulatory Visit: Payer: 59 | Admitting: Psychiatry

## 2022-05-12 ENCOUNTER — Ambulatory Visit (INDEPENDENT_AMBULATORY_CARE_PROVIDER_SITE_OTHER): Payer: 59 | Admitting: Psychiatry

## 2022-05-19 ENCOUNTER — Ambulatory Visit (INDEPENDENT_AMBULATORY_CARE_PROVIDER_SITE_OTHER): Payer: 59 | Admitting: Psychiatry

## 2022-05-19 DIAGNOSIS — F411 Generalized anxiety disorder: Secondary | ICD-10-CM | POA: Diagnosis not present

## 2022-05-19 NOTE — Progress Notes (Signed)
[facility] Counselor/Therapist Progress Note  Patient ID: Jazzi Pastran, MRN: [id number],    Date: 05/19/2022  Time Spent: 50 minutes start time 8:02 AM end time 8:52 AM Virtual Visit via Video Note Connected with patient by a telemedicine/telehealth application, with their informed consent, and verified patient privacy and that I am speaking with the correct person using two identifiers. I discussed the limitations, risks, security and privacy concerns of performing psychotherapy and the availability of in person appointments. I also discussed with the patient that there may be a patient responsible charge related to this service. The patient expressed understanding and agreed to proceed. I discussed the treatment planning with the patient. The patient was provided an opportunity to ask questions and all were answered. The patient agreed with the plan and demonstrated an understanding of the instructions. The patient was advised to call  our office if  symptoms worsen or feel they are in a crisis state and need immediate contact.   Therapist Location:home Patient Location: hotel in [place]    Treatment Type: Individual Therapy  Reported Symptoms: anxiety, sadness, chronic pain, triggered responses  Mental Status Exam:  Appearance:   Well Groomed     Behavior:  Appropriate  Motor:  Normal  Speech/Language:   Normal Rate  Affect:  Appropriate  Mood:  anxious  Thought process:  normal  Thought content:    WNL  Sensory/Perceptual disturbances:    WNL  Orientation:  oriented to person, place, time/date, and situation  Attention:  Good  Concentration:  Good  Memory:  WNL  Fund of knowledge:   Good  Insight:    Good  Judgment:   Good  Impulse Control:  Good   Risk Assessment: Danger to Self:  No Self-injurious Behavior: No Danger to Others: No Duty to Warn:no Physical Aggression / Violence:No  Access to Firearms a concern: No  Gang Involvement:No   Subjective: Met  with patient via virtual session.  She shared that she was still having health issues and had an allergic reaction to the antibiotic that her PCP had put her on for her eat infection.  She is getting ready to fly to [place] for work.  She shared that work has been difficult and she has had more bad experiences with having to find clients in bad parts of town.  She did move with her boyfriend but realizes that she is not sure it was a good thing.  She shared she is not sure how to handle things since he has been a support for her but she does not feel he can be who she has let him know she needs him to be for her and for himself. Patient was encouraged to take time on her trip and do some journalling and think through what is going to be best for her.. She shared she knows that it will be hard for her to move on with things but she feels that she will not grow until she does.  Patient is to continue work on her own healing over the next few weeks.  Interventions: Solution-Oriented/Positive Psychology  Diagnosis:   ICD-10-CM   1. Generalized anxiety disorder  F41.1       Plan:  Patient is to use CBT and coping skills to decrease anxiety symptoms.  Patient is to continue working with physicians on the different issues that she is having medically and try to find something positive through the journey. Patient is to focus on what she  can control fix and change.  Patient is to exercise to release negative emotions appropriately.  Patient is to work on the 21-day detox steps for the neuro cycle by Dr. De Burrs to help make sure her thoughts are moving in a positive direction.  Patient is to start looking into different options for her future.  Patient is to take medication as directed. Long-term goal: Resolve the core conflict that is the source of anxiety Short-term goal: Describe current and past experiences with specific fears prominent worries and anxiety symptoms including their impact on functioning  and attempts to resolve it  [name], [facility]

## 2022-05-27 ENCOUNTER — Telehealth: Payer: Self-pay | Admitting: Psychiatry

## 2022-05-27 DIAGNOSIS — F411 Generalized anxiety disorder: Secondary | ICD-10-CM

## 2022-05-27 DIAGNOSIS — F988 Other specified behavioral and emotional disorders with onset usually occurring in childhood and adolescence: Secondary | ICD-10-CM

## 2022-05-27 DIAGNOSIS — F39 Unspecified mood [affective] disorder: Secondary | ICD-10-CM

## 2022-05-27 DIAGNOSIS — F5105 Insomnia due to other mental disorder: Secondary | ICD-10-CM

## 2022-05-27 DIAGNOSIS — F410 Panic disorder [episodic paroxysmal anxiety] without agoraphobia: Secondary | ICD-10-CM

## 2022-05-27 MED ORDER — LAMOTRIGINE 150 MG PO TABS: 150.0000 mg | ORAL_TABLET | Freq: Every day | ORAL | 1 refills | Status: DC

## 2022-05-27 MED ORDER — ALPRAZOLAM 2 MG PO TABS: 2.0000 mg | ORAL_TABLET | Freq: Every day | ORAL | 0 refills | Status: DC | PRN

## 2022-05-27 MED ORDER — AMPHETAMINE-DEXTROAMPHETAMINE 20 MG PO TABS: 20.0000 mg | ORAL_TABLET | Freq: Two times a day (BID) | ORAL | 0 refills | Status: DC

## 2022-05-27 MED ORDER — LURASIDONE HCL 40 MG PO TABS: ORAL_TABLET | ORAL | 0 refills | Status: DC

## 2022-05-27 NOTE — Telephone Encounter (Signed)
Received request for medication refills to be sent to [facility] at [address] [place] [address]. Please let pt know that scripts were sent.

## 2022-05-28 NOTE — Telephone Encounter (Signed)
I called local pharmacy to get last fill date. She had a Rx filled waiting for pickup, which messed with the dates. I asked pharmacist to cancel RF. I then called [facility] pharmacy and it went thru for $7.00.  Pharmacy said it would be ready tomorrow. Notified patient.

## 2022-05-28 NOTE — Telephone Encounter (Signed)
Addendum to attached message. Bethany Terry called and pharmacy told her that she can't get her Lamotrigine until 6/10. She is out now and [facility] told [facility] that they didn't get a request for the Lamotrigine. Her phone number is [phone].Pharmacy is: The one attached to this message.

## 2022-06-09 ENCOUNTER — Ambulatory Visit (INDEPENDENT_AMBULATORY_CARE_PROVIDER_SITE_OTHER): Payer: 59 | Admitting: Psychiatry

## 2022-06-09 DIAGNOSIS — F411 Generalized anxiety disorder: Secondary | ICD-10-CM
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
## 2022-06-12 ENCOUNTER — Telehealth (INDEPENDENT_AMBULATORY_CARE_PROVIDER_SITE_OTHER): Payer: 59 | Admitting: Psychiatry

## 2022-06-12 ENCOUNTER — Encounter: Payer: Self-pay | Admitting: Psychiatry

## 2022-06-12 VITALS — BP 108/64 | HR 88

## 2022-06-12 DIAGNOSIS — F988 Other specified behavioral and emotional disorders with onset usually occurring in childhood and adolescence: Secondary | ICD-10-CM | POA: Diagnosis not present

## 2022-06-12 DIAGNOSIS — F411 Generalized anxiety disorder: Secondary | ICD-10-CM

## 2022-06-12 DIAGNOSIS — F410 Panic disorder [episodic paroxysmal anxiety] without agoraphobia: Secondary | ICD-10-CM

## 2022-06-12 DIAGNOSIS — F39 Unspecified mood [affective] disorder: Secondary | ICD-10-CM

## 2022-06-12 MED ORDER — LAMOTRIGINE 150 MG PO TABS
150.0000 mg | ORAL_TABLET | Freq: Every day | ORAL | 5 refills | Status: DC
Start: 2022-06-12 — End: 2022-09-09

## 2022-06-12 MED ORDER — AMPHETAMINE-DEXTROAMPHETAMINE 20 MG PO TABS
20.0000 mg | ORAL_TABLET | Freq: Two times a day (BID) | ORAL | 0 refills | Status: DC
Start: 2022-07-23 — End: 2022-09-09

## 2022-06-12 MED ORDER — AMPHETAMINE-DEXTROAMPHETAMINE 20 MG PO TABS
20.0000 mg | ORAL_TABLET | Freq: Two times a day (BID) | ORAL | 0 refills | Status: DC
Start: 2022-08-20 — End: 2022-09-09

## 2022-06-12 MED ORDER — AMPHETAMINE-DEXTROAMPHETAMINE 20 MG PO TABS: 20.0000 mg | ORAL_TABLET | Freq: Two times a day (BID) | ORAL | 0 refills | Status: DC
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
## 2022-07-08 ENCOUNTER — Ambulatory Visit: Payer: 59 | Admitting: Psychiatry

## 2022-07-21 ENCOUNTER — Ambulatory Visit (INDEPENDENT_AMBULATORY_CARE_PROVIDER_SITE_OTHER): Payer: 59 | Admitting: Psychiatry

## 2022-07-21 DIAGNOSIS — F411 Generalized anxiety disorder: Secondary | ICD-10-CM | POA: Diagnosis not present

## 2022-07-21 NOTE — Progress Notes (Signed)
[facility] Counselor/Therapist Progress Note  Patient ID: Bethany Terry, MRN: [id number],    Date: 07/21/2022  Time Spent: 52 minutes start time 4:02 PM end time 4:54 PM Virtual Visit via video note Connected with patient by a telemedicine/telehealth application, with their informed consent, and verified patient privacy and that I am speaking with the correct person using two identifiers. I discussed the limitations, risks, security and privacy concerns of performing psychotherapy and the availability of in person appointments. I also discussed with the patient that there may be a patient responsible charge related to this service. The patient expressed understanding and agreed to proceed. I discussed the treatment planning with the patient. The patient was provided an opportunity to ask questions and all were answered. The patient agreed with the plan and demonstrated an understanding of the instructions. The patient was advised to call  our office if  symptoms worsen or feel they are in a crisis state and need immediate contact.   Therapist Location: home Patient Location: home    Treatment Type: Individual Therapy  Reported Symptoms: anxiety, panic, triggered responses, sadness, fatigue  Mental Status Exam:  Appearance:   Casual     Behavior:  Appropriate  Motor:  Normal  Speech/Language:   Normal Rate  Affect:  Appropriate  Mood:  anxious  Thought process:  normal  Thought content:    WNL  Sensory/Perceptual disturbances:    WNL  Orientation:  oriented to person, place, time/date, and situation  Attention:  Good  Concentration:  Good  Memory:  WNL  Fund of knowledge:   Good  Insight:    Good  Judgment:   Good  Impulse Control:  Good   Risk Assessment: Danger to Self:  No Self-injurious Behavior: No Danger to Others: No Duty to Warn:no Physical Aggression / Violence:No  Access to Firearms a concern: No  Gang Involvement:No   Subjective: Met with patient via  virtual session. She shared she is sick and has ear infections in both ears. She shared she explained she has been on antibiotics for a month due to different medical issues. She has her blood work done and is waiting for results. She shared it is hard for her to work with the fatigue.She shared she is giving up on what to do with the medical issues because it doesn't seem that anyone can figure out what is going on with her.  She shared she just feels crazy with her health and there not being answers. She went on to share she feels she has lots of trauma over her ear infection that almost went sceptic. She did break up with her boyfriend and he is already seeing someone else. She went on to share that work has been stressful and overwhelming.  She was not sure if she was going to loose her job or not.  Everything is fine currently and they are discussing her taking on a new role. Things are okay with her parent and she is living again with her dad. She shared her friend is still having lots of cancer testing still. She shared she is going back to [facility] for work and the last time she went it was real bad. Discussed ways to help her deal with the anxiety of having to go back there again. She is seeing someone new and he treats her good even though she still gets panicky about it.  Encouraged her to continue taking things 1 step at a time and to see  what happens with her job over the next week.  Patient was encouraged to make sure she does what she needs to make sure she feels safe and secure and is able to take care of herself while she is in [place].  Interventions: Solution-Oriented/Positive Psychology and Insight-Oriented  Diagnosis:   ICD-10-CM   1. Generalized anxiety disorder  F41.1       Plan: Patient is to use CBT and coping skills to decrease anxiety symptoms.  Patient is to continue working with physicians on the different issues that she is having medically and try to find something positive through  the journey.  Patient is to follow plans from session to make sure she feels safe and secure while she goes on her trip to [place] and at home.  Patient is to focus on what she can control fix and change.  Patient is to exercise to release negative emotions appropriately.  Patient is to work on the 21-day detox steps for the neuro cycle by Dr. De Burrs to help make sure her thoughts are moving in a positive direction.  Patient is to start looking into different options for her future.  Patient is to take medication as directed. Long-term goal: Resolve the core conflict that is the source of anxiety Short-term goal: Describe current and past experiences with specific fears prominent worries and anxiety symptoms including their impact on functioning and attempts to resolve it  [name], [facility]

## 2022-08-11 ENCOUNTER — Ambulatory Visit (INDEPENDENT_AMBULATORY_CARE_PROVIDER_SITE_OTHER): Payer: 59 | Admitting: Psychiatry

## 2022-08-11 DIAGNOSIS — F411 Generalized anxiety disorder: Secondary | ICD-10-CM | POA: Diagnosis not present

## 2022-08-11 NOTE — Progress Notes (Unsigned)
[facility] Counselor/Therapist Progress Note  Patient ID: Bethany Terry, MRN: [id number],    Date: 08/11/2022  Time Spent: 34 minutes start time 3:22 PM end time 3:56 PM Virtual Visit via Video Note Connected with patient by a telemedicine/telehealth application, with their informed consent, and verified patient privacy and that I am speaking with the correct person using two identifiers. I discussed the limitations, risks, security and privacy concerns of performing psychotherapy and the availability of in person appointments. I also discussed with the patient that there may be a patient responsible charge related to this service. The patient expressed understanding and agreed to proceed. I discussed the treatment planning with the patient. The patient was provided an opportunity to ask questions and all were answered. The patient agreed with the plan and demonstrated an understanding of the instructions. The patient was advised to call  our office if  symptoms worsen or feel they are in a crisis state and need immediate contact.   Therapist Location: home Patient Location: home    Treatment Type: Individual Therapy  Reported Symptoms: anxiety, pain, health issues  Mental Status Exam:  Appearance:   Well Groomed     Behavior:  Appropriate  Motor:  Normal  Speech/Language:   Normal Rate  Affect:  Appropriate  Mood:  anxious  Thought process:  normal  Thought content:    WNL  Sensory/Perceptual disturbances:    WNL  Orientation:  oriented to person, place, time/date, and situation  Attention:  Good  Concentration:  Good  Memory:  WNL  Fund of knowledge:   Good  Insight:    Good  Judgment:   Good  Impulse Control:  Good   Risk Assessment: Danger to Self:  No Self-injurious Behavior: No Danger to Others: No Duty to Warn:no Physical Aggression / Violence:No  Access to Firearms a concern: No  Gang Involvement:No   Subjective: Met with patient via virtual session. She  shared she is continuing to have ear pain and is working with the doctors. She is also having episodes of passing out and she is not sure what to do. She is still figuring out what to do with her work situation. She shared she is living with her dad and he is not cleaning again which is stressful.  Patient went on to share all of the medical issues she is currently having and how that is creating the most stress for her because she is not sure what to do or how to progress.  Discussed all she can do is to continue working with her providers.  She was asked by her ENT to go to a neurologist and [facility] and she admitted she had not made the appointment but agreed to call right after session.  Patient was encouraged to think about potentially working in office or remotely so she is not traveling so much which seems to be difficult for her body right now.  Patient agreed to continue trying to work on her self-care and reminding herself that she is enough.  She is also going to continue to try and maintain perspective with her dad since she is living with him currently.  Interventions: Solution-Oriented/Positive Psychology  Diagnosis:   ICD-10-CM   1. Generalized anxiety disorder  F41.1       Plan: Patient is to use CBT and coping skills to decrease anxiety symptoms.  Patient is to continue working with physicians on the different issues that she is having medically and try to  find something positive through the journey.  Patient is to follow plans from session to make sure she feels safe and secure while she goes on her trip to [place] and at home.  Patient is to focus on what she can control fix and change.  Patient is to exercise to release negative emotions appropriately.  Patient is to work on the 21-day detox steps for the neuro cycle by Dr. De Burrs to help make sure her thoughts are moving in a positive direction.  Patient is to start looking  Long-term goal: Resolve the core conflict that is  the source of anxiety Short-term goal: Describe current and past experiences with specific fears prominent worries and anxiety symptoms including their impact on functioning and attempts to resolve it  [name], [facility]

## 2022-09-04 ENCOUNTER — Ambulatory Visit: Payer: 59 | Admitting: Psychiatry

## 2022-09-04 DIAGNOSIS — F411 Generalized anxiety disorder: Secondary | ICD-10-CM

## 2022-09-04 NOTE — Progress Notes (Signed)
[facility] Counselor/Therapist Progress Note  Patient ID: Bethany Terry, MRN: [id number],    Date: 09/04/2022  Time Spent: 55 minutes start time 5:05  PM end time 6 PM Virtual Visit via Video Note Connected with patient by a telemedicine/telehealth application, with their informed consent, and verified patient privacy and that I am speaking with the correct person using two identifiers. I discussed the limitations, risks, security and privacy concerns of performing psychotherapy and the availability of in person appointments. I also discussed with the patient that there may be a patient responsible charge related to this service. The patient expressed understanding and agreed to proceed. I discussed the treatment planning with the patient. The patient was provided an opportunity to ask questions and all were answered. The patient agreed with the plan and demonstrated an understanding of the instructions. The patient was advised to call  our office if  symptoms worsen or feel they are in a crisis state and need immediate contact.   Therapist Location: office Patient Location: Hotel in [place]    Treatment Type: Individual Therapy  Reported Symptoms: health issues, anxiety, fatigue, sadness, increase BP, triggered responses  Mental Status Exam:  Appearance:   Casual     Behavior:  Appropriate  Motor:  Normal  Speech/Language:   Normal Rate  Affect:  Appropriate  Mood:  anxious  Thought process:  normal  Thought content:    WNL  Sensory/Perceptual disturbances:    WNL  Orientation:  oriented to person, place, time/date, and situation  Attention:  Good  Concentration:  Good  Memory:  WNL  Fund of knowledge:   Good  Insight:    Good  Judgment:   Good  Impulse Control:  Good   Risk Assessment: Danger to Self:  No Self-injurious Behavior: No Danger to Others: No Duty to Warn:no Physical Aggression / Violence:No  Access to Firearms a concern: No  Gang Involvement:No    Subjective: Met with patient via  virtual session. She shared she is out of town for work and she got sick while she was there. She is going to have surgery on her both her ears. She is having anxiety over all that she may have to go through. She went on to share that she went through a bad experience at work and she has been trying to process it. She shared she is recognizing that she has to trust her gut.  Patient was given the opportunity to discuss how she was feeling about health issues and concerns about surgery.  She shares she is not sure she wants to get surgery on both her ears.  Have her think through the pros and cons of the situation and as she discussed the situation she recognized she really does not have an option and that the best thing for her to do at this point is to continue working with the surgeon and follow the recommendations since at this point her situation will lead her to have to have surgery on the 30 years.  Patient was also agreeable to contacting a cardiologist as her doctor had recommended to get in for an appointment and make sure she is okay in that area as well.  Patient was given time to discuss the situation that had happened at work and was encouraged to think through ways that she could feel safe and calm herself so that she can continue to work.  Also encouraged her to make sure that if her phone does not  have service that she does not go into a home on her own.  Discussed importance of her self-care and she reported she is walking, drinking water, and praying for self care but she is not eating well.  She agreed that she would work on eating better as well as continuing the other things she is doing for herself.  Interventions: Solution-Oriented/Positive Psychology  Diagnosis:   ICD-10-CM   1. Generalized anxiety disorder  F41.1       Plan:  Patient is to use CBT and coping skills to decrease anxiety symptoms.  Patient is to continue working with physicians on  the different issues that she is having medically and try to find something positive through the journey.  Patient is to follow plans from session to make sure she keeps herself safe and continues to work on her self-care.  Patient is to focus on what she can control fix and change.  Patient is to exercise to release negative emotions appropriately.  Patient is to work on the 21-day detox steps for the neuro cycle by Dr. De Burrs to help make sure her thoughts are moving in a positive direction.  Patient is to start looking  Long-term goal: Resolve the core conflict that is the source of anxiety Short-term goal: Describe current and past experiences with specific fears prominent worries and anxiety symptoms including their impact on functioning and attempts to resolve it  [name], [facility]

## 2022-09-09 ENCOUNTER — Encounter: Payer: Self-pay | Admitting: Psychiatry

## 2022-09-09 ENCOUNTER — Telehealth (INDEPENDENT_AMBULATORY_CARE_PROVIDER_SITE_OTHER): Payer: 59 | Admitting: Psychiatry

## 2022-09-09 DIAGNOSIS — F988 Other specified behavioral and emotional disorders with onset usually occurring in childhood and adolescence: Secondary | ICD-10-CM

## 2022-09-09 DIAGNOSIS — F39 Unspecified mood [affective] disorder: Secondary | ICD-10-CM

## 2022-09-09 DIAGNOSIS — F411 Generalized anxiety disorder: Secondary | ICD-10-CM

## 2022-09-09 MED ORDER — LAMOTRIGINE 150 MG PO TABS
150.0000 mg | ORAL_TABLET | Freq: Every day | ORAL | 5 refills | Status: DC
Start: 2022-09-09 — End: 2022-10-30

## 2022-09-09 NOTE — Progress Notes (Signed)
Bethany Terry [id number] 2000-05-31 22 y.o.  Virtual Visit via Video Note  I connected with pt @ on 09/09/22 at  8:30 AM EDT by a video enabled telemedicine application and verified that I am speaking with the correct person using two identifiers.   I discussed the limitations of evaluation and management by telemedicine and the availability of in person appointments. The patient expressed understanding and agreed to proceed.  I discussed the assessment and treatment plan with the patient. The patient was provided an opportunity to ask questions and all were answered. The patient agreed with the plan and demonstrated an understanding of the instructions.   The patient was advised to call back or seek an in-person evaluation if the symptoms worsen or if the condition fails to improve as anticipated.  I provided 28 minutes of non-face-to-face time during this encounter.  The patient was located at work.  The provider was located at [facility].   Corie Chiquito, PMHNP   Subjective:    Patient ID:  Bethany Terry is a 22 y.o. (DOB [birth year]) female.  Chief Complaint:  Chief Complaint  Patient presents with   Follow-up    ADHD, Anxiety, and mood disturbance    HPI Bethany Terry presents for follow-up of ADHD, Anxiety, and mood disturbance.   She reports anxiety has been "all over the place" in response to stressors, "but things have been calm the last 2 weeks." She reports that she has been dealing with repeated ear infections.   She reports that she has not taken Adderall for about about 2 months. She reports difficulty with concentration and noticing procrastination. Mood has been "for the most part I've been pretty calm, not too happy, and not too sad." Low energy. Reports motivation for certain things. "Overall, I still have a drive to do things." She reports sleep varies. Some difficulty falling asleep. Appetite varies. Noticed increased appetite with OCP and is off  hormones. Denies SI.   Reports frequent travel for work.   Adderall XR Adderall IR Vyvanse Ritalin Xanax Propranolol Buspar-Adverse reaction Cymbalta Sertraline Celexa Prozac Lexapro Buspar-Rash Wellbutrin XL- Somewhat helpful Strattera Lamictal Trazodone- Caused hallucinations Clonidine    Review of Systems:  Review of Systems  Constitutional:  Positive for diaphoresis.  HENT:  Positive for ear pain and hearing loss.        Recurrent ear infections  Cardiovascular:  Positive for palpitations.       Reports resting HR is highly variable (low 60's-190). Reports that she will receive high HR alerts on her Apple Watch, sometimes when sleeping or about to fall asleep. Has not been taking Adderall recently.   Gastrointestinal:  Positive for nausea.  Musculoskeletal:  Negative for gait problem.  Neurological:        Reports near syncopal episodes  Psychiatric/Behavioral:         Please refer to HPI  Has apt with neurologist in late October.  Medications: I have reviewed the patient's current medications.  Current Outpatient Medications  Medication Sig Dispense Refill   albuterol (VENTOLIN HFA) 108 (90 Base) MCG/ACT inhaler Inhale 2 puffs into the lungs every 6 (six) hours as needed for wheezing. (Patient not taking: Reported on 12/27/2021) 1 Inhaler 0   lamoTRIgine (LAMICTAL) 150 MG tablet Take 1 tablet (150 mg total) by mouth daily. 30 tablet 5   No current facility-administered medications for this visit.    Medication Side Effects: None  Allergies:  Allergies  Allergen Reactions   Augmentin [Amoxicillin-Pot Clavulanate]  Reports palpitations and syncope   Buspar [Buspirone] Rash   Ibuprofen Other (See Comments)    Patient reports a history of gastric ulcers, states she is does not tolerate ibuprofen   Naproxen Rash    Past Medical History:  Diagnosis Date   Anxiety    Arthritis    Depression    Insomnia    Lupus (HCC)     Family History   Adopted: Yes    Social History   Socioeconomic History   Marital status: Single    Spouse name: Not on file   Number of children: Not on file   Years of education: Not on file   Highest education level: Not on file  Occupational History   Not on file  Tobacco Use   Smoking status: Never   Smokeless tobacco: Never  Vaping Use   Vaping status: Never Used  Substance and Sexual Activity   Alcohol use: No   Drug use: No   Sexual activity: Yes    Birth control/protection: Implant  Other Topics Concern   Not on file  Social History Narrative   02/25/2012 AHW Bethany Terry was born in [place], and adopted at 11 months by her current family. She lives with her maternal grandparents, father, mother, and 7 year old brother. She began this year in the eighth grade at the [employer], but plans to transfer to [employer]. She enjoys hanging out with her friends, playing basketball and volleyball for her church leagues. 02/25/2012 AHW   Current school [employer] in the 8th grade.   Social Determinants of Health   Financial Resource Strain: Low Risk  (03/16/2022)   Received from [facility], [facility]   Overall Financial Resource Strain (CARDIA)    Difficulty of Paying Living Expenses: Not very hard  Recent Concern: Financial Resource Strain - Medium Risk (02/10/2022)   Received from [facility]   Overall Financial Resource Strain (CARDIA)    Difficulty of Paying Living Expenses: Somewhat hard  Food Insecurity: No Food Insecurity (03/16/2022)   Received from [facility]   Hunger Vital Sign    Worried About Running Out of Food in the Last Year: Never true    Ran Out of Food in the Last Year: Never true  Transportation Needs: No Transportation Needs (03/16/2022)   Received from [facility], [facility]   PRAPARE - Transportation    Lack of Transportation (Medical): No    Lack of Transportation (Non-Medical): No  Physical  Activity: Sufficiently Active (03/16/2022)   Received from [facility], [facility]   Exercise Vital Sign    Days of Exercise per Week: 7 days    Minutes of Exercise per Session: 60 min  Stress: Stress Concern Present (03/16/2022)   Received from [facility] - Occupational Stress Questionnaire    Feeling of Stress : To some extent  Social Connections: Socially Integrated (03/16/2022)   Received from [facility], [facility]   Social Network    How would you rate your social network (family, work, friends)?: Good participation with social networks  Intimate Partner Violence: Not At Risk (08/17/2022)   Received from [facility]   HITS    Over the last 12 months how often did your partner physically hurt you?: 1    Over the last 12 months how often did your partner insult you or talk down to you?: 1    Over the last  12 months how often did your partner threaten you with physical harm?: 1    Over the last 12 months how often did your partner scream or curse at you?: 1    Past Medical History, Surgical history, Social history, and Family history were reviewed and updated as appropriate.   Please see review of systems for further details on the patient's review from today.   Objective:   Physical Exam:  There were no vitals taken for this visit.  Physical Exam Neurological:     Mental Status: She is alert and oriented to person, place, and time.     Cranial Nerves: No dysarthria.  Psychiatric:        Attention and Perception: Attention and perception normal.        Mood and Affect: Mood is not depressed.        Speech: Speech normal.        Behavior: Behavior is cooperative.        Thought Content: Thought content normal. Thought content is not paranoid or delusional. Thought content does not include homicidal or suicidal ideation. Thought content does not include homicidal or suicidal plan.        Cognition and  Memory: Cognition and memory normal.        Judgment: Judgment normal.     Comments: Insight intact Mood is anxious in response to recent stressors and health issues     Lab Review:     Component Value Date/Time   NA 139 09/17/2021 0302   K 4.1 09/17/2021 0302   CL 103 09/17/2021 0302   CO2 25 09/02/2021 1953   GLUCOSE 108 (H) 09/17/2021 0302   BUN 9 09/17/2021 0302   CREATININE 0.60 09/17/2021 0302   CALCIUM 9.7 09/02/2021 1953   PROT 7.7 10/10/2017 1811   ALBUMIN 4.2 10/10/2017 1811   AST 23 10/10/2017 1811   ALT 18 10/10/2017 1811   ALKPHOS 57 10/10/2017 1811   BILITOT 0.7 10/10/2017 1811   GFRNONAA >60 09/02/2021 1953   GFRAA >60 09/08/2019 0006       Component Value Date/Time   WBC 14.5 (H) 09/17/2021 0253   RBC 5.05 09/17/2021 0253   HGB 14.3 09/17/2021 0302   HCT 42.0 09/17/2021 0302   PLT 229 09/17/2021 0253   MCV 82.6 09/17/2021 0253   MCH 28.9 09/17/2021 0253   MCHC 35.0 09/17/2021 0253   RDW 11.7 09/17/2021 0253   LYMPHSABS 2.2 09/17/2021 0253   MONOABS 0.9 09/17/2021 0253   EOSABS 0.0 09/17/2021 0253   BASOSABS 0.1 09/17/2021 0253    No results found for: "POCLITH", "LITHIUM"   No results found for: "PHENYTOIN", "PHENOBARB", "VALPROATE", "CBMZ"   .res Assessment: Plan:    33 minutes spent dedicated to the care of this patient on the date of this encounter to include pre-visit review of records, ordering of medication, post visit documentation, and face-to-face time with the patient discussing tachycardia and cardiac symptoms. Agree with ED provider's recommendation to see cardiology for evaluation. She reports that she has not taken Adderall in about 2 months and has continued to experience cardiac symptoms. Recommend not re-starting Adderall until evaluated and cleared by cardiology to resume Adderall. Pt verbalizes understanding. Advised that she could contact office if cleared by cardiology and she would like to resume Adderall prior to next  appointment.  Will continue Lamictal 150 mg daily for mood stabilization.  Recommend continuing therapy with [name], [facility].  Pt to follow-up in 3 months or sooner  if clinically indicated.  Patient advised to contact office with any questions, adverse effects, or acute worsening in signs and symptoms.   Darcella was seen today for follow-up.  Diagnoses and all orders for this visit:  Generalized anxiety disorder  Episodic mood disorder (HCC) -     lamoTRIgine (LAMICTAL) 150 MG tablet; Take 1 tablet (150 mg total) by mouth daily.  ADD (attention deficit disorder) without hyperactivity     Please see After Visit Summary for patient specific instructions.  No future appointments.   No orders of the defined types were placed in this encounter.     -------------------------------

## 2022-09-30 ENCOUNTER — Emergency Department (HOSPITAL_BASED_OUTPATIENT_CLINIC_OR_DEPARTMENT_OTHER)
Admission: EM | Admit: 2022-09-30 | Discharge: 2022-09-30 | Disposition: A | Discharge status: Home / Self Care | Payer: Managed Care, Other (non HMO) | Attending: Emergency Medicine | Admitting: Emergency Medicine

## 2022-09-30 ENCOUNTER — Encounter (HOSPITAL_BASED_OUTPATIENT_CLINIC_OR_DEPARTMENT_OTHER): Payer: Self-pay | Admitting: Emergency Medicine

## 2022-09-30 DIAGNOSIS — H9201 Otalgia, right ear: Secondary | ICD-10-CM | POA: Diagnosis present

## 2022-09-30 MED ORDER — CEFDINIR 300 MG PO CAPS
300.0000 mg | ORAL_CAPSULE | Freq: Two times a day (BID) | ORAL | 0 refills | Status: AC
Start: 2022-09-30 — End: ?

## 2022-09-30 MED ORDER — KETOROLAC TROMETHAMINE 15 MG/ML IJ SOLN
30.0000 mg | Freq: Once | INTRAMUSCULAR | Status: AC
  Administered 2022-09-30: 30 mg via INTRAMUSCULAR
  Filled 2022-09-30: qty 2

## 2022-09-30 MED ORDER — CIPROFLOXACIN-DEXAMETHASONE 0.3-0.1 % OT SUSP
4.0000 [drp] | Freq: Two times a day (BID) | OTIC | 0 refills | Status: AC
Start: 2022-09-30 — End: ?

## 2022-09-30 NOTE — ED Provider Notes (Signed)
Bethany Terry AT [facility] Provider Note   CSN: [id number] Arrival date & time: 09/30/22  1837     History  Chief Complaint  Patient presents with   Otalgia    Bethany Terry is a 22 y.o. female.  Patient with history of bilateral cholesteatoma, follows with otolaryngology at [facility] --presents to the emergency Terry for evaluation of right ear pain.  Patient states that she has had history of recurrent ear infections including otitis media and otitis externa.  She has been on Omnicef as well as Corticosporin drops and has had trials of prednisone.  States that she was told that she may need surgery for the "cysts".  She denies head injuries.  No documented fevers.  No vomiting.  No swelling about the ear.       Home Medications Prior to Admission medications   Medication Sig Start Date End Date Taking? Authorizing Provider  cefdinir (OMNICEF) 300 MG capsule Take 1 capsule (300 mg total) by mouth 2 (two) times daily. 09/30/22  Yes Renne Crigler, PA-C  ciprofloxacin-dexamethasone (CIPRODEX) OTIC suspension Place 4 drops into the right ear 2 (two) times daily. 09/30/22  Yes Renne Crigler, PA-C  albuterol (VENTOLIN HFA) 108 (90 Base) MCG/ACT inhaler Inhale 2 puffs into the lungs every 6 (six) hours as needed for wheezing. Patient not taking: Reported on 12/27/2021 10/10/17   Gerhard Munch, MD  lamoTRIgine (LAMICTAL) 150 MG tablet Take 1 tablet (150 mg total) by mouth daily. 09/09/22 10/09/22  Corie Chiquito, PMHNP      Allergies    Augmentin [amoxicillin-pot clavulanate], Buspar [buspirone], Ibuprofen, and Naproxen    Review of Systems   Review of Systems  Physical Exam Updated Vital Signs BP 115/89 (BP Location: Right Arm)   Pulse 100   Temp 97.9 F (36.6 C) (Oral)   Resp 18   LMP 09/15/2022 (Approximate)   SpO2 100%   Physical Exam Vitals and nursing note reviewed.  Constitutional:      Appearance: She is well-developed.  HENT:      Head: Normocephalic and atraumatic.     Jaw: No trismus.     Right Ear: External ear normal. Tympanic membrane is erythematous.     Left Ear: Tympanic membrane and external ear normal.     Ears:     Comments: Visualization of the TMs are reduced.  There is a fair amount of debris in the canals bilaterally.  Patient has pain on the right side with pulling on the tragus.  No mastoid tenderness bilaterally.    Nose: Nose normal. No mucosal edema or rhinorrhea.     Mouth/Throat:     Mouth: Mucous membranes are moist. Mucous membranes are not dry. No oral lesions.     Pharynx: Uvula midline. No oropharyngeal exudate, posterior oropharyngeal erythema or uvula swelling.     Tonsils: No tonsillar abscesses.  Eyes:     General:        Right eye: No discharge.        Left eye: No discharge.     Conjunctiva/sclera: Conjunctivae normal.  Cardiovascular:     Rate and Rhythm: Normal rate and regular rhythm.     Heart sounds: Normal heart sounds.  Pulmonary:     Effort: Pulmonary effort is normal. No respiratory distress.     Breath sounds: Normal breath sounds. No wheezing or rales.  Abdominal:     Palpations: Abdomen is soft.     Tenderness: There is no abdominal tenderness.  Musculoskeletal:  Cervical back: Normal range of motion and neck supple.  Lymphadenopathy:     Cervical: No cervical adenopathy.  Skin:    General: Skin is warm and dry.  Neurological:     Mental Status: She is alert.  Psychiatric:        Mood and Affect: Mood normal.     ED Results / Procedures / Treatments   Labs (all labs ordered are listed, but only abnormal results are displayed) Labs Reviewed - No data to display  EKG None  Radiology No results found.  Procedures Procedures    Medications Ordered in ED Medications  ketorolac (TORADOL) 15 MG/ML injection 30 mg (30 mg Intramuscular Given 09/30/22 1944)    ED Course/ Medical Decision Making/ A&P    Patient seen and examined. History  obtained directly from patient.  I reviewed otolaryngology notes from [facility].  Work-up including labs, imaging, EKG ordered in triage, if performed, were reviewed.    Labs/EKG: None ordered  Imaging: None ordered  Medications/Fluids: None ordered  Most recent vital signs reviewed and are as follows: BP 115/89 (BP Location: Right Arm)   Pulse 100   Temp 97.9 F (36.6 C) (Oral)   Resp 18   LMP 09/15/2022 (Approximate)   SpO2 100%   Initial impression: Right ear pain, most likely otitis externa, cannot rule out otitis media.  Patient will be placed on medications and encouraged to follow-up with otolaryngology as planned.  IM Toradol prior to discharge for pain  Home treatment plan: OTC pain meds  Return instructions discussed with patient: New or worsening symptoms  Follow-up instructions discussed with patient: Follow-up with PCP                                Medical Decision Making Risk Prescription drug management.   Patient with ongoing difficulties with ear pain and infections, followed by ENT for bilateral cholesteatomas.  No signs of mastoiditis or malignant otitis externa clinically today.  Patient appears well, nontoxic.  She will be started back on medications and encouraged follow-up with her specialist.         Final Clinical Impression(s) / ED Diagnoses Final diagnoses:  Right ear pain    Rx / DC Orders ED Discharge Orders          Ordered    ciprofloxacin-dexamethasone (CIPRODEX) OTIC suspension  2 times daily        09/30/22 1949    cefdinir (OMNICEF) 300 MG capsule  2 times daily        09/30/22 1949              Renne Crigler, Cordelia Poche 09/30/22 1955    Charlynne Pander, MD 09/30/22 [phone]

## 2022-09-30 NOTE — ED Triage Notes (Signed)
Right sided ear infection started today Reports hx of severe ear infection.

## 2022-10-13 ENCOUNTER — Ambulatory Visit: Payer: 59 | Admitting: Psychiatry

## 2022-10-14 NOTE — Progress Notes (Signed)
Patient unable to meet due to sickness

## 2022-10-30 ENCOUNTER — Telehealth: Payer: Self-pay | Admitting: Psychiatry

## 2022-10-30 DIAGNOSIS — F39 Unspecified mood [affective] disorder: Secondary | ICD-10-CM

## 2022-10-30 MED ORDER — LAMOTRIGINE 150 MG PO TABS: 150.0000 mg | ORAL_TABLET | Freq: Every day | ORAL | 0 refills | Status: DC

## 2022-10-30 NOTE — Telephone Encounter (Signed)
Sent 30 day supply to requested pharmacy.

## 2022-10-30 NOTE — Telephone Encounter (Signed)
Pt called at 1:15p requesting that the refill of Lamictal that was sent to [place] be transferred to   [facility] [address] [address] [place], [place].   Phone [phone]  Pt said she is travelling and needs it sent there.  No upcoming appt scheduled.

## 2022-11-19 ENCOUNTER — Encounter: Payer: Self-pay | Admitting: Psychiatry

## 2022-11-24 ENCOUNTER — Ambulatory Visit (INDEPENDENT_AMBULATORY_CARE_PROVIDER_SITE_OTHER): Payer: 59 | Admitting: Psychiatry

## 2022-11-24 DIAGNOSIS — F411 Generalized anxiety disorder: Secondary | ICD-10-CM | POA: Diagnosis not present

## 2022-11-24 NOTE — Progress Notes (Unsigned)
[facility] Counselor/Therapist Progress Note  Patient ID: Bethany Terry, MRN: [id number],    Date: 11/24/2022  Time Spent: 56 minutes start time 4:01 PM end time 4:57 PM Virtual Visit via Video Note Connected with patient by a telemedicine/telehealth application, with their informed consent, and verified patient privacy and that I am speaking with the correct person using two identifiers. I discussed the limitations, risks, security and privacy concerns of performing psychotherapy and the availability of in person appointments. I also discussed with the patient that there may be a patient responsible charge related to this service. The patient expressed understanding and agreed to proceed. I discussed the treatment planning with the patient. The patient was provided an opportunity to ask questions and all were answered. The patient agreed with the plan and demonstrated an understanding of the instructions. The patient was advised to call  our office if  symptoms worsen or feel they are in a crisis state and need immediate contact.   Therapist Location: home Patient Location: work car in [employer] [place]    Treatment Type: Individual Therapy  Reported Symptoms: anxiety, panic, health issues, sleep issues, nightmares, fatigue  Mental Status Exam:  Appearance:   Casual     Behavior:  Appropriate  Motor:  Normal  Speech/Language:   Normal Rate  Affect:  Appropriate  Mood:  anxious  Thought process:  normal  Thought content:    WNL  Sensory/Perceptual disturbances:    WNL  Orientation:  oriented to person, place, time/date, and situation  Attention:  Good  Concentration:  Good  Memory:  WNL  Fund of knowledge:   Good  Insight:    Good  Judgment:   Good  Impulse Control:  Good   Risk Assessment: Danger to Self:  No Self-injurious Behavior: No Danger to Others: No Duty to Warn:no Physical Aggression / Violence:No  Access to Firearms a concern: No  Gang Involvement:No    Subjective: Met with patient via virtual session. Patient shared she has started her new job and that has been a good thing for her. She shared she has moved in with her boyfriend and got 2 cats.  She has been having fainting spells and other stroke symptoms. She is also supposed the have ear surgery but it has to be postponed. She went on to share she is feeling better in all areas of her life other than her health. She shared she is happy with her boyfriend,living situation, and work.  Patient went on to share several different symptoms she was having and MRI of her ear surgery is settled appropriate and she is hopeful that she will be cleared to be able to have a soon.  Patient was encouraged to recognize what she is doing and that things are moving overall in a positive direction with her wife so she has to stay focused on the good.  Encouraged her to continue taking it herself at a time regarding her health issues follow up with her providers and to discussed herself to pay attention to the symptoms that she is having.  Also encouraged her to try and remind herself that it is temporary and that she will get to the other side of things.  Interventions: Cognitive Behavioral Therapy  Diagnosis:   ICD-10-CM   1. Generalized anxiety disorder  F41.1       Plan: Patient is to use CBT and coping skills to decrease anxiety symptoms.  Patient is to continue working with physicians on the  different issues that she is having medically and try to find something positive through the journey.  Patient is to follow plans from session to make sure she keeps herself safe and continues to work on her self-care.  Patient is to focus on what she can control fix and change.  Patient is to exercise to release negative emotions appropriately.  Patient is to work on the 21-day detox steps for the neuro cycle by Dr. De Burrs to help make sure her thoughts are moving in a positive direction.  Patient is to continue  working with providers on her health issues. Long-term goal: Resolve the core conflict that is the source of anxiety Short-term goal: Describe current and past experiences with specific fears prominent worries and anxiety symptoms including their impact on functioning and attempts to resolve it  [name], [facility]

## 2022-12-01 ENCOUNTER — Other Ambulatory Visit: Payer: Self-pay

## 2022-12-01 DIAGNOSIS — F39 Unspecified mood [affective] disorder: Secondary | ICD-10-CM

## 2022-12-01 MED ORDER — LAMOTRIGINE 150 MG PO TABS: 150.0000 mg | ORAL_TABLET | Freq: Every day | ORAL | 0 refills | Status: AC

## 2024-01-04 ENCOUNTER — Telehealth: Payer: Self-pay | Admitting: Psychiatry

## 2024-01-04 NOTE — Telephone Encounter (Signed)
 Pt was a patient of [facility] and had moved out of the area. She said she has called multiple times to try to get an appt with a new provider. She is on Lamictal , said she has only missed 1 dose and is asking for a RF.

## 2024-01-04 NOTE — Telephone Encounter (Signed)
 Pt had moved to [place], but is back in [place] now.

## 2024-01-04 NOTE — Telephone Encounter (Signed)
 Patient called in for refill on Lamotrigine  150mg . She saw [facility] last in 2024 and hasn't found a new provider as of yet. She asked if medication could be filled. PH: [phone] [facility] [address] [place], [place]

## 2024-01-05 NOTE — Telephone Encounter (Signed)
 Appt w/ LS on 01/11/24

## 2024-01-05 NOTE — Telephone Encounter (Signed)
 Pt reports that she got Lamictal  RF elsewhere. She reports she only missed one day. Reports she has seizures if she doesn't take it and was anxious.

## 2024-01-11 ENCOUNTER — Ambulatory Visit: Admitting: Psychiatry

## 2024-01-11 ENCOUNTER — Ambulatory Visit (INDEPENDENT_AMBULATORY_CARE_PROVIDER_SITE_OTHER): Payer: Self-pay | Admitting: Emergency Medicine

## 2024-01-11 DIAGNOSIS — Z91199 Patient's noncompliance with other medical treatment and regimen due to unspecified reason: Secondary | ICD-10-CM

## 2024-01-11 NOTE — Progress Notes (Signed)
 NO SHOW  Patient did not present for scheduled appointment today.   No clinical evaluation was performed.  Staff notified to contact patient to reschedule at earliest convenience.

## 2024-01-13 ENCOUNTER — Ambulatory Visit (INDEPENDENT_AMBULATORY_CARE_PROVIDER_SITE_OTHER): Admitting: Psychiatry

## 2024-01-13 DIAGNOSIS — F411 Generalized anxiety disorder: Secondary | ICD-10-CM
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
## 2024-01-14 ENCOUNTER — Ambulatory Visit: Admitting: Emergency Medicine
